# Patient Record
Sex: Female | Born: 1955 | Race: White | Hispanic: No | Smoking: Never smoker
Health system: Southern US, Community
[De-identification: ages and names within clinical notes are randomized; demographics above are authoritative.]

## PROBLEM LIST (undated history)

## (undated) DIAGNOSIS — M1990 Unspecified osteoarthritis, unspecified site: Secondary | ICD-10-CM

## (undated) DIAGNOSIS — Z9889 Other specified postprocedural states: Secondary | ICD-10-CM

## (undated) DIAGNOSIS — N189 Chronic kidney disease, unspecified: Secondary | ICD-10-CM

## (undated) DIAGNOSIS — I10 Essential (primary) hypertension: Secondary | ICD-10-CM

## (undated) DIAGNOSIS — M797 Fibromyalgia: Secondary | ICD-10-CM

## (undated) HISTORY — DX: Chronic kidney disease, unspecified: N18.9

## (undated) HISTORY — PX: ABDOMINAL HYSTERECTOMY: SHX81

## (undated) HISTORY — PX: CHOLECYSTECTOMY: SHX55

---

## 2004-08-28 ENCOUNTER — Other Ambulatory Visit: Payer: Self-pay

## 2005-01-08 ENCOUNTER — Ambulatory Visit: Payer: Self-pay | Admitting: Cardiovascular Disease

## 2005-05-11 ENCOUNTER — Emergency Department: Payer: Self-pay | Admitting: Emergency Medicine

## 2005-08-28 ENCOUNTER — Ambulatory Visit: Payer: Self-pay | Admitting: Internal Medicine

## 2005-09-11 ENCOUNTER — Ambulatory Visit: Payer: Self-pay | Admitting: Internal Medicine

## 2005-09-23 ENCOUNTER — Other Ambulatory Visit: Payer: Self-pay

## 2005-10-03 ENCOUNTER — Ambulatory Visit: Payer: Self-pay | Admitting: Unknown Physician Specialty

## 2005-11-05 ENCOUNTER — Ambulatory Visit: Payer: Self-pay | Admitting: Obstetrics & Gynecology

## 2005-11-07 ENCOUNTER — Observation Stay: Payer: Self-pay | Admitting: Unknown Physician Specialty

## 2006-01-07 ENCOUNTER — Ambulatory Visit: Payer: Self-pay | Admitting: Internal Medicine

## 2006-01-10 ENCOUNTER — Ambulatory Visit: Payer: Self-pay | Admitting: Internal Medicine

## 2006-03-07 ENCOUNTER — Ambulatory Visit (HOSPITAL_COMMUNITY): Admission: RE | Admit: 2006-03-07 | Discharge: 2006-03-07 | Payer: Self-pay | Admitting: Neurosurgery

## 2006-03-26 ENCOUNTER — Encounter: Admission: RE | Admit: 2006-03-26 | Discharge: 2006-03-26 | Payer: Self-pay | Admitting: Neurosurgery

## 2006-04-09 ENCOUNTER — Encounter: Admission: RE | Admit: 2006-04-09 | Discharge: 2006-04-09 | Payer: Self-pay | Admitting: Neurosurgery

## 2006-09-04 ENCOUNTER — Encounter: Admission: RE | Admit: 2006-09-04 | Discharge: 2006-09-04 | Payer: Self-pay | Admitting: Neurosurgery

## 2006-09-17 ENCOUNTER — Ambulatory Visit: Payer: Self-pay | Admitting: Gastroenterology

## 2006-11-07 ENCOUNTER — Ambulatory Visit: Payer: Self-pay | Admitting: Internal Medicine

## 2007-06-09 ENCOUNTER — Encounter: Admission: RE | Admit: 2007-06-09 | Discharge: 2007-06-09 | Payer: Self-pay | Admitting: Neurosurgery

## 2007-07-14 ENCOUNTER — Inpatient Hospital Stay (HOSPITAL_COMMUNITY): Admission: RE | Admit: 2007-07-14 | Discharge: 2007-07-16 | Payer: Self-pay | Admitting: Neurosurgery

## 2007-09-14 ENCOUNTER — Encounter: Payer: Self-pay | Admitting: Neurosurgery

## 2007-10-10 ENCOUNTER — Encounter: Payer: Self-pay | Admitting: Neurosurgery

## 2007-12-05 IMAGING — CT CT L SPINE W/ CM
4 of 13 series · 9 of 33 positions shown, 10 images · IV contrast (omnipaque)
Comparison: none

CLINICAL DATA: Low back and bilateral lower extremity pain. No relief from
epidural steroid injections.

THORACIC MYELOGRAM: 
LUMBAR MYELOGRAM:
CT THORACIC SPINE WITH INTRATHECAL CONTRAST:
CT LUMBAR SPINE WITH INTRATHECAL CONTRAST:
TECHNIQUE: An appropriate entry site was determined under fluoroscopy. Skin site
was marked, prepped with Betadine, and draped in usual sterile fashion, and
infiltrated locally with 1% lidocaine. A 22 gauge spinal needle was  advanced
into the thecal sac at L4 from a left interlaminar approach . Clear colorless
CSF returned. 10 ml Omnipaque 100 were administered intrathecally for thoracic
and lumbar myelography, followed by axial CT scanning of the thoracic and lumbar
spine. Coronal and sagittal reconstructions were generated from the axial scans.

[Series 2: t & l spine · axial · 0.31mm/px · z∈[-472,-289]mm · 2 of 219 slices shown]
[im 73/219  bone]
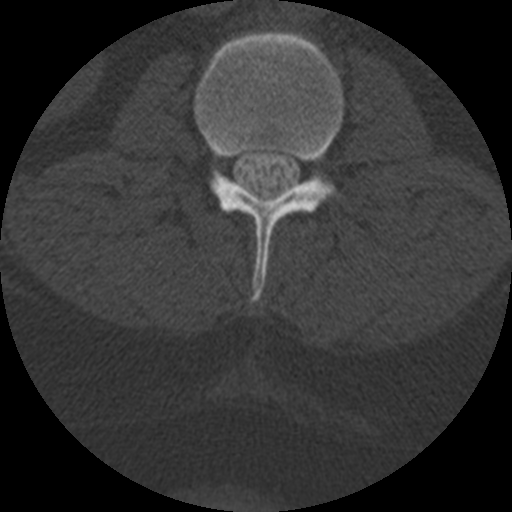
[im 146/219  bone]
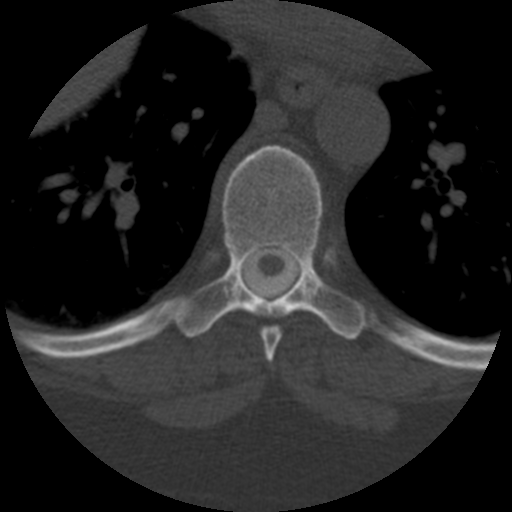

[Series 3: bone windows · axial · 0.31mm/px · z∈[-516,-244]mm · 3 of 219 slices shown, 4 images]
[im 55/219  soft-tissue]
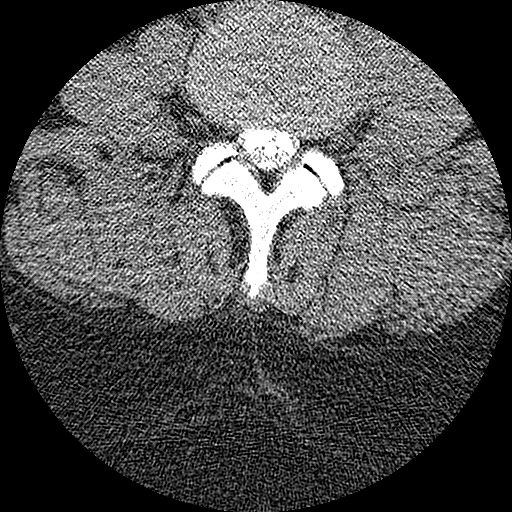
[im 55/219  bone]
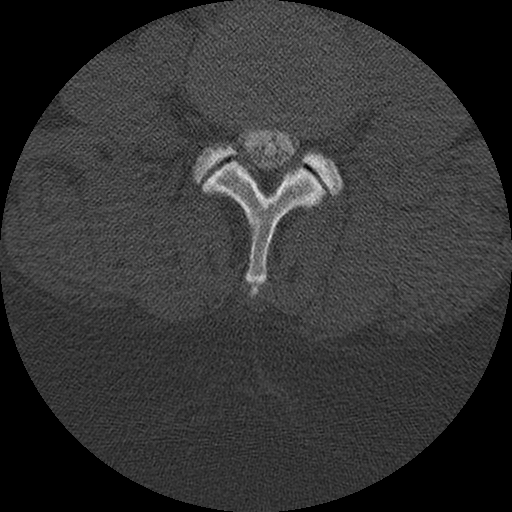
[im 110/219  bone]
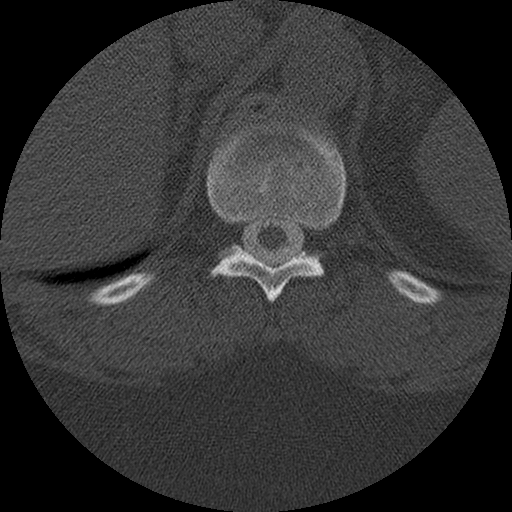
[im 164/219  bone]
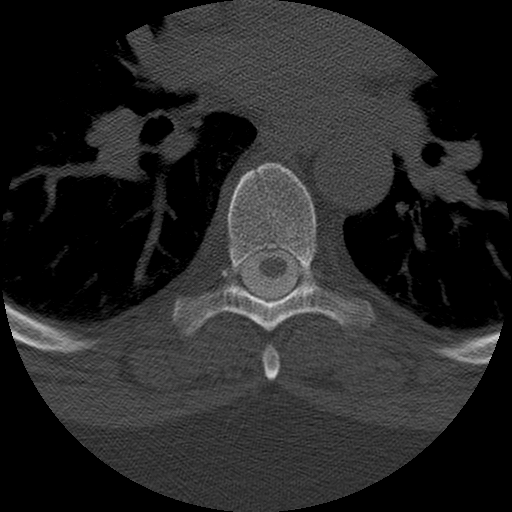

[Series 103: sagittal t spine · sagittal · 0.72mm/px · 2 of 40 slices shown]
[im 14/40  bone]
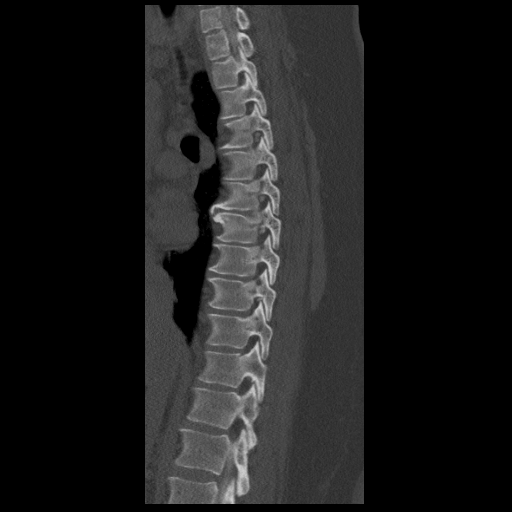
[im 27/40  bone]
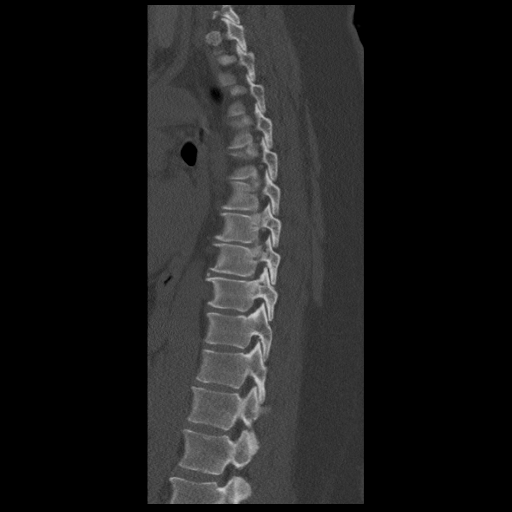

[Series 400: coronal t & l · coronal · 1.09mm/px · 2 of 60 slices shown]
[im 20/60  bone]
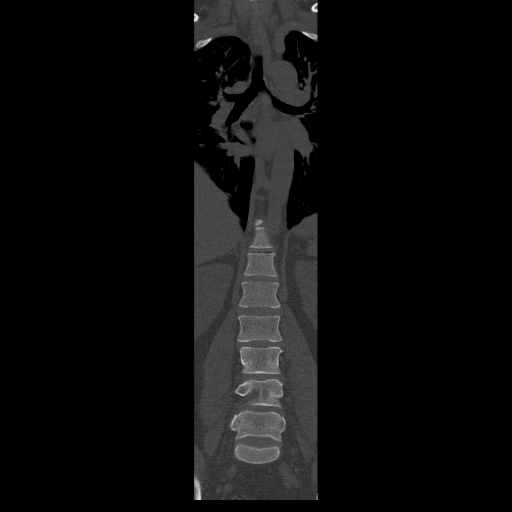
[im 40/60  bone]
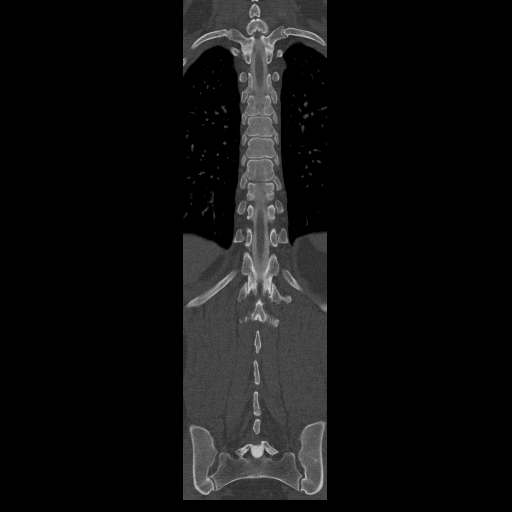

[9 of 33 positions shown; findings below may reference images not displayed]

FINDINGS: Thoracic spine: 

C7-T5: Unremarkable

T5-T6: Right paracentral herniation indenting and distorting the right anterior
aspect of the thoracic cord. No definite change since previous scan.

T6-T7: Minimal left right paracentral bulge without cord distortion. Stable
since previous scan.

T7-T10: Unremarkable

T10-T11: Bilateral facet degenerative hypertrophy with a anteromedial spurring
encroaching on the central canal resulting in a mild trefoil configuration of
the thoracic cord and thecal sac, stable since previous scan. Small Schmorl's
nodes are noted in the adjacent endplates.

T11-T12: Small anterior plate spurs with early vacuum phenomenon anteriorly. No
posterior disc bulge or protrusion. 

T12-L1: Unremarkable

Lumbar spine:

L1-L2: Normal appearing conus terminates at the interspace level. No significant
protrusion or herniation.

L2-L3: Mild disc bulge and foraminal encroachment left greater than right

L3-L4: Mild asymmetric disc bulge left greater than right with early left
foraminal encroachment. Bilateral early facet degenerative hypertrophy. No
spinal stenosis.

L4-L5: Mild circumferential disc bulge. More advanced bilateral facet
degenerative hypertrophy with vacuum phenomenon, thickening of ligamentum
flavum, and narrowing of the lateral dimension of the thecal sac. Early
bilateral foraminal encroachment, right greater than left.

L5-S1: Mild bilateral facet degenerative hypertrophy. Interspace unremarkable.
Foramina widely patent.
IMPRESSION: 1. Stable right paracentral herniation T5-T6 with cord distortion.
2. Stable facet degenerative changes T10-T11 with early narrowing of the thecal
sac.
3. Stable mild disc bulges L2-L3, L3-L4 without significant compressive
pathology.
4. Early multifactorial spinal stenosis L4-L5, stable.

## 2008-02-03 ENCOUNTER — Ambulatory Visit: Payer: Self-pay | Admitting: Internal Medicine

## 2008-02-05 ENCOUNTER — Ambulatory Visit: Payer: Self-pay | Admitting: Internal Medicine

## 2008-03-15 ENCOUNTER — Inpatient Hospital Stay: Payer: Self-pay | Admitting: Internal Medicine

## 2008-03-15 ENCOUNTER — Other Ambulatory Visit: Payer: Self-pay

## 2008-03-16 ENCOUNTER — Other Ambulatory Visit: Payer: Self-pay

## 2008-05-12 ENCOUNTER — Ambulatory Visit: Payer: Self-pay | Admitting: Orthopaedic Surgery

## 2008-07-12 ENCOUNTER — Emergency Department: Payer: Self-pay | Admitting: Emergency Medicine

## 2008-07-22 ENCOUNTER — Encounter: Admission: RE | Admit: 2008-07-22 | Discharge: 2008-07-22 | Payer: Self-pay | Admitting: Orthopaedic Surgery

## 2008-07-27 ENCOUNTER — Ambulatory Visit: Payer: Self-pay | Admitting: Internal Medicine

## 2008-08-10 ENCOUNTER — Encounter
Admission: RE | Admit: 2008-08-10 | Discharge: 2008-08-12 | Payer: Self-pay | Admitting: Physical Medicine & Rehabilitation

## 2008-08-12 ENCOUNTER — Ambulatory Visit: Payer: Self-pay | Admitting: Physical Medicine & Rehabilitation

## 2008-08-26 ENCOUNTER — Encounter: Admission: RE | Admit: 2008-08-26 | Discharge: 2008-11-24 | Payer: Self-pay | Admitting: Anesthesiology

## 2008-08-30 ENCOUNTER — Ambulatory Visit: Payer: Self-pay | Admitting: Anesthesiology

## 2008-09-13 ENCOUNTER — Ambulatory Visit: Payer: Self-pay | Admitting: Anesthesiology

## 2008-10-11 ENCOUNTER — Ambulatory Visit: Payer: Self-pay | Admitting: Anesthesiology

## 2008-10-25 ENCOUNTER — Ambulatory Visit: Payer: Self-pay | Admitting: Anesthesiology

## 2008-11-24 ENCOUNTER — Encounter
Admission: RE | Admit: 2008-11-24 | Discharge: 2009-02-22 | Payer: Self-pay | Admitting: Physical Medicine & Rehabilitation

## 2008-11-25 ENCOUNTER — Ambulatory Visit: Payer: Self-pay | Admitting: Physical Medicine & Rehabilitation

## 2008-12-30 ENCOUNTER — Ambulatory Visit: Payer: Self-pay | Admitting: Physical Medicine & Rehabilitation

## 2009-03-13 ENCOUNTER — Emergency Department: Payer: Self-pay | Admitting: Emergency Medicine

## 2009-03-15 ENCOUNTER — Ambulatory Visit: Payer: Self-pay | Admitting: Internal Medicine

## 2009-03-23 ENCOUNTER — Encounter
Admission: RE | Admit: 2009-03-23 | Discharge: 2009-03-27 | Payer: Self-pay | Admitting: Physical Medicine & Rehabilitation

## 2009-03-27 ENCOUNTER — Ambulatory Visit: Payer: Self-pay | Admitting: Physical Medicine & Rehabilitation

## 2009-07-10 ENCOUNTER — Encounter
Admission: RE | Admit: 2009-07-10 | Discharge: 2009-10-08 | Payer: Self-pay | Admitting: Physical Medicine & Rehabilitation

## 2009-07-17 ENCOUNTER — Ambulatory Visit: Payer: Self-pay | Admitting: Physical Medicine & Rehabilitation

## 2009-07-29 ENCOUNTER — Ambulatory Visit: Payer: Self-pay | Admitting: Internal Medicine

## 2009-08-22 ENCOUNTER — Encounter: Admission: RE | Admit: 2009-08-22 | Discharge: 2009-08-22 | Payer: Self-pay | Admitting: Internal Medicine

## 2009-08-25 ENCOUNTER — Encounter: Admission: RE | Admit: 2009-08-25 | Discharge: 2009-08-29 | Payer: Self-pay | Admitting: Anesthesiology

## 2009-08-29 ENCOUNTER — Ambulatory Visit: Payer: Self-pay | Admitting: Anesthesiology

## 2010-02-15 ENCOUNTER — Encounter: Payer: Self-pay | Admitting: Rheumatology

## 2010-03-09 ENCOUNTER — Encounter: Payer: Self-pay | Admitting: Rheumatology

## 2010-03-19 ENCOUNTER — Ambulatory Visit: Payer: Self-pay | Admitting: Internal Medicine

## 2010-04-08 ENCOUNTER — Encounter: Payer: Self-pay | Admitting: Rheumatology

## 2010-04-09 ENCOUNTER — Ambulatory Visit: Payer: Self-pay | Admitting: Gastroenterology

## 2010-05-09 ENCOUNTER — Encounter: Payer: Self-pay | Admitting: Rheumatology

## 2010-06-18 ENCOUNTER — Ambulatory Visit: Payer: Self-pay | Admitting: Rheumatology

## 2010-12-30 ENCOUNTER — Encounter: Payer: Self-pay | Admitting: Neurosurgery

## 2011-04-23 NOTE — Op Note (Signed)
NAMEAVIGAIL, PILLING                 ACCOUNT NO.:  [id number]   MEDICAL RECORD NO.:  [id number]          PATIENT TYPE:  INP   LOCATION:  3172                         FACILITY:  [facility]   PHYSICIAN:  Hilda Lias, M.D.   DATE OF BIRTH:  04-17-1956   DATE OF PROCEDURE:  DATE OF DISCHARGE:                               OPERATIVE REPORT   Audio too short to transcribe (less than 5 seconds)           ______________________________  Hilda Lias, M.D.     EB/MEDQ  D:  07/14/2007  T:  07/14/2007  Job:  [id number]

## 2011-04-23 NOTE — Procedures (Signed)
NAME:  Sharon Boyd, Sharon Boyd NO.:  [id number]   MEDICAL RECORD NO.:  [id number]         PATIENT TYPE:  HREC   LOCATION:                                 FACILITY:   PHYSICIAN:  Celene Kras, MD        DATE OF BIRTH:  1956-03-18   DATE OF PROCEDURE:  DATE OF DISCHARGE:                               OPERATIVE REPORT   [facility].  I evaluated  her, reviewed the health and history, and performed 14-point review of  systems.  1. Reviewed the chart, progress to date, symptoms, and discussed      treatment limitations and options.  I used models.  I discussed in      lay terms.  2. Describing a spondylitic pain, minimal arm pain, more of a facetal      overlay.  It is reasonable to go on with cervical facet      intervention.  Her work is aggravating her neck, particularly to      the left side, although she has some modest right-sided pain.  Left      side is most problematic for her.  She has to look over shoulder      and she rides a forklift all day and this does contribute to quite      a bit of the myofascial pain, and functional impairment.  We will      go ahead and block the facet at the medial branch, as most      problematic levels are described at 5, 6, and 7 and 4 will be      addressed as contributory innervation.  This would be at      independent needle access points.  3. Maintain contact with primary care.  4. Another rationale to perform intervention is to minimize escalation      of controlled substances.  Questions are answered and discussed in      lay terms.  She is familiar with these interventions as she has had      them with [facility], this is mostly in her low back, not      in her neck.  She has been followed by Orthopedist, no particular      surgical plan is in place at this time.  We will see her for      sequential intervention in followup, consider RF with positive      predictive  experience, and I have discussed benchmarks for her.   Objectively, diffuse paracervical myofascial with positive cervical  facetal compression test, particularly left greater than right.  Suboccipital compression test positive.  Suprascapular levator scapular  amplification, left and right, but notably more so on the left.  Pain  with extension.  Nothing new neurologically.   IMPRESSION:  Spondylosis, degenerative spinal disease, cervical spine.   PLAN:  1. Cervical facet medial branch injection at C4, C5, C6, and C7 left      side, independent needle access points under local anesthetic.  Predicate further intervention based on need and overall response.      Questions answered, discussed in lay terms.  2. Followup.  Discussed home-based therapy.  3. Questions were answered.  I used models.  She is consented.   The patient taken to the fluoroscopy suite and placed in supine  position.  Neck prepped and draped in the usual fashion.  Using a 25-  gauge needle, I advanced the cervical facet at the medial branch of C4,  C5, C6, and C7 with contributory innervation addressed.  Confirmed  placement.  I then injected 1.5 mL of lidocaine 1% MPF at each level  with a total of 40 mg of Aristocort in divided doses.   She tolerated the procedure well.  No complication from our procedure.  Appropriate recovery.  Discharge instructions given.           ______________________________  Celene Kras, MD     HH/MEDQ  D:  08/30/2008 13:50:36  T:  08/31/2008 02:25:59  Job:  [id number]

## 2011-04-23 NOTE — Procedures (Signed)
NAME:  Sharon Boyd, Sharon Boyd                 ACCOUNT NO.:  [id number]   MEDICAL RECORD NO.:  [id number]          PATIENT TYPE:  REC   LOCATION:  TPC                          FACILITY:  [facility]   PHYSICIAN:  Erick Colace, M.D.DATE OF BIRTH:  1956-04-06   DATE OF PROCEDURE:  DATE OF DISCHARGE:                               OPERATIVE REPORT   INDICATION:  Occipital neuralgia.   Informed consent was obtained after describing risks and benefits of the  procedure with the patient.  These include bleeding, bruising,  infection.  She elects to proceed and has given written consent.   The patient in seated position, area marked at the base of occiput.  Mark prepped with Betadine, entered with 25-gauge 1-1/2-inch needle  solution containing 1 mL of 4 mg/mL dexamethasone and 1 mL of 1%  lidocaine injected.  The patient tolerated the procedure well.  Post  injection instructions given.      Erick Colace, M.D.  Electronically Signed     AEK/MEDQ  D:  11/25/2008 16:09:44  T:  11/26/2008 05:27:15  Job:  [id number]

## 2011-04-23 NOTE — Assessment & Plan Note (Signed)
A 55 year old female with cervical pain, has done well with  radiofrequency neuropathy.  This was last performed on October 12, 2008.  She has been off of narcotic analgesic medications, maintained on  tramadol 50 mg 2 p.o. t.i.d. and gabapentin.  She will be due for  refills later on this month.  Her gabapentin dose is 300 p.o. t.i.d.   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  She  states in some way improved her neck pain since she is not looking up  all the time, but her pain is still in 6-7/10 range.  This is higher  than last time where it was 5/10.  Her pain interference score is 5-  6/10.   She needs assist with household duties and shopping.  Otherwise  independent.   REVIEW OF SYSTEMS:  Positive for weakness, dizziness, depression,  anxiety, nausea, limb swelling, sleep apnea, weight gain.   Examination; blood pressure 133/89, pulse 94, respirations 18 and O2 sat  98% on room air.  General, no acute distress.  Mood and affect  appropriate.  Upper extremity strength is normal.  Upper extremity deep  tendon reflex is normal.  No evidence of intrinsic atrophy.  Sensation  is normal.  Lumbar spine range of motion is 75% forward flexion and  extension.  Her lower extremity strength is normal.  Deep tendon  reflexes are normal.   IMPRESSION:  1. Cervical pain due to facet syndrome.  We will have her go to get a      repeat RF per Dr. Stevphen Rochester.  2. Lumbar post laminectomy syndrome, well controlled on current pain      medications.  I will see her back in about 6 months.  Dr. Stevphen Rochester      will see her in next month.      Erick Colace, M.D.  Electronically Signed     AEK/MedQ  D:  07/17/2009 12:41:33  T:  07/18/2009 01:48:21  Job #:  [id number]   cc:   Celene Kras, MD  Fax: [phone]   Sharolyn Douglas, M.D.  Fax: [phone]

## 2011-04-23 NOTE — Procedures (Signed)
NAME:  SANSA, ALKEMA NO.:  [id number]   MEDICAL RECORD NO.:  [id number]          PATIENT TYPE:  REC   LOCATION:  TPC                          FACILITY:  [facility]   PHYSICIAN:  Celene Kras, MD        DATE OF BIRTH:  Jan 29, 1956   DATE OF PROCEDURE:  DATE OF DISCHARGE:                               OPERATIVE REPORT   Sharon Boyd comes in for pain management today.  I evaluated her and  reviewed the health and history form and 14-point review of systems.   Sharon Boyd comes to [place] today, demonstrating a clear response to left RF,  stating that the right side is evident, requesting intervention here.  Her frequency, intensity, and duration of headache have diminished, but  they are focused more to the right.  Spondylotic pain, minimal  myelopathy.  It is reasonable to go on to RF at the third occipital  nerve, particularly at the bifurcation, with a four and five.  This will  be on the right side, with risks, complications, and options are  reinforced.   OBJECTIVE:  She is improved to the left side, better range of motion,  and less suprascapular myofascial pain to the left side.  The right side  is so problematic with positive cervical facet compression test but  nothing new neurologically.   IMPRESSION:  Spondylosis, degenerative spine disease, cervical spine.   PLAN:  Cervical facet medial branch intervention, with radiofrequency  neuroablation 5 mm active tip to the right side at 3, 4, and 5  independent needle access points.  She has consented to predicate  further intervention based on need and overall response.   The patient was taken to the fluoroscopy suite and placed in the supine  position.  The neck was prepped and draped in the usual fashion.  Using  a 22-gauge RF needle, I advanced the cervical facet at the medial branch  at C3, 4, and 5.  Her right side independent needle access points under  local anesthetic confirmed placement appropriately, motor and  sensory  stimulation following with 60 degrees lesion, at 60 seconds at each  level.   Preceding this was 0.5 mL of lidocaine 1% MPF at each level with a total  of 40 mg of Aristocort in divided dose.   She tolerated the procedure well.  No complications from our procedure.  Appropriate recovery.  Discharge instructions given.  I will see her in  followup.           ______________________________  Celene Kras, MD     HH/MEDQ  D:  10/25/2008 14:16:49  T:  10/26/2008 06:26:16  Job:  [id number]

## 2011-04-23 NOTE — H&P (Signed)
NAME:  Sharon Boyd, NADAL NO.:  [id number]   MEDICAL RECORD NO.:  [id number]          PATIENT TYPE:  INP   LOCATION:  NA                           FACILITY:  [facility]   PHYSICIAN:  Hilda Lias, M.D.   DATE OF BIRTH:  12-24-1955   DATE OF ADMISSION:  DATE OF DISCHARGE:                              HISTORY & PHYSICAL   HISTORY:  The patient is  __________  who I have been following for more  than a year, complaining of pain to the shoulder down to the coccyx,  pain in both legs associated with numbness.  Coughing increases the  pain.  The patient had workup, which showed some T10-11 positive  stenosis and  __________  with MRI.  She is complaining of increase of  the pain to both her upper extremities.  The x-rays showed that she had  stenosis of the level L4-5, as well as incidental thoracic stenosis.  Because of the pain, she wants to proceed with surgery.   PAST MEDICAL HISTORY:  1. Hysterectomy.  2. Heart catheterization.  3. Cholecystectomy.   ALLERGIES:  She is allergic to CODEINE.   SOCIAL HISTORY:  She drinks socially.  She does not smoke.  She is 5  feet 10 inches, 260 pounds.   REVIEW OF SYSTEMS:  Positive for high cholesterol, high blood pressure,  back pain, swelling of the legs.   PHYSICAL EXAMINATION:  HEAD:  Normocephalic, normal..  NECK:  Normal.  LUNGS:  Clear.  HEART:  Normal __________  normal pulses.  NEURO:  Showed she has straight leg raising, it is __________  on the  right side __________  45 degrees.  She has some mild weakness  __________  both legs.  Grip strength is 1+.   Lumbar spine x-ray showed that she has stenosis at the level L4-5 and  incidental stenosis at the level of T10-11  which has not changed.   IMPRESSION:  The patient is being admitted for surgery.  The procedure  will be a bilateral 4-5 laminectomy and foraminotomy.  She knows about  the risks __________  paralysis, need for further surgery.  __________ .           ______________________________  Hilda Lias, M.D.     EB/MEDQ  D:  07/14/2007  T:  07/14/2007  Job:  [id number]

## 2011-04-23 NOTE — Assessment & Plan Note (Signed)
Sharon Boyd follows up today.  She had good relief with occipital nerve  injection, although symptoms seem to be coming back.  She has overall  done quite well after her cervical radiofrequency procedure.  Her back  pain has been doing well after the physical therapy keeps up with this.  Her main complaint is looking upward seems to bother her.   She has another complaint of episodes of nausea, vomiting, and dizziness  about twice a week, seem to occur when she is driving, forklift, and  looking upward, but sometimes they also occur spontaneously.   Her pain with activity is about 4/10.  She is working in third shift and  overall sleep is fair.  Relief from meds is fair.  She can walk 10  minutes at a time.  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  She has some dizziness, depression, nausea, and vomiting at times.   PHYSICAL EXAMINATION:  VITAL SIGNS:  Blood pressure 141/94, pulse 88,  respirations 18, and O2 sat 99% on room air.  GENERAL:  No acute distress.  Orientation x3.  Affect alert.  Gait is  normal.  EXTREMITIES:  She has normal coordination of upper and lower extremity.  No deep tendon reflex.  Normal sensation.  Turning her head side-to-side  or upper down does not provoke any of her symptoms.  She has normal  strength in the upper extremities.  Normal gait pattern.  BACK:  There is no tenderness to palpation.  Lumbar range of motion 50%  forward flexion and extension.  NECK:  Range of motion 50% extension, flexion, and lateral bending.  NEUROLOGIC:  Cranial nerves II through XII are intact.   IMPRESSION:  1. Lumbar spondylosis without myelopathy.  2. Lumbar post laminectomy syndrome, improved.  3. Cervicalgia, improved after cervical radiofrequency procedure.   FOLLOWUP:  Intermittent dizziness, nausea.  I suspect this may be more  middle ear related and would like her to follow up with her primary care  physician on this.  We discussed this.  She is in  agreement with the  treatment plans all, otherwise see her back in 2 months.  We will  continue tramadol 2 p.o. t.i.d. as well as gabapentin 1 p.o. t.i.d. at  300 mg formulation.   Her Oswestry disability index score today is 44%.      Erick Colace, M.D.  Electronically Signed     AEK/MedQ  D:  12/30/2008 17:36:55  T:  12/31/2008 09:07:09  Job #:  [id number]   cc:   Sharolyn Douglas, M.D.  Fax: [phone]

## 2011-04-23 NOTE — Op Note (Signed)
Sharon Boyd, Sharon Boyd                 ACCOUNT NO.:  [id number]   MEDICAL RECORD NO.:  [id number]          PATIENT TYPE:  INP   LOCATION:  3172                         FACILITY:  [facility]   PHYSICIAN:  Hilda Lias, M.D.   DATE OF BIRTH:  02/03/1956   DATE OF PROCEDURE:  07/14/2007  DATE OF DISCHARGE:                               OPERATIVE REPORT   PREOPERATIVE DIAGNOSIS:  Lumbar stenosis L4-5.   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   PROCEDURE:  Bilateral L4-5 laminectomy, bilateral L4-5, L5-S1  foraminotomy, removal of L4-5 synovial cyst.  Microscope.   SURGICAL PROCEDURES:  Hilda Lias, M.D.   ASSISTANT:  Coletta Memos, M.D.   MEDICAL HISTORY:  The patient has been followed by me for more than a  year complaining of back pain with radiation to both legs.  The pain is  getting worse.  She had two myelograms, the one back in March of 2007  and the one at the beginning of July of this year which showed that she  had stenosis at the level of L4-5.  She also has incidental thoracic  stenosis.  Surgery was advised.  The patient knew all the risks such as  infection, CSF leak, worsening pain, paralysis, need for further  surgery.   DESCRIPTION OF PROCEDURE:  The patient was taken to the OR and she was  positioned in a prone manner.  The back was cleaned with DuraPrep.  X-  rays showed that the needle was at the level of 4-5. From then on, we  were doing an incision in the skin and subcutaneous tissue 3-1/2 to 4  inches thick adipose tissue.  We found the spinous process and  retraction of the muscle and the ligament was done laterally.  At the  end we were able to feel the area between 4-5 and 5-1. With the  __________  we removed the spinous process of 5 and 4.  With the drill  and using 2 and 3 mm Kerrison punch with the bilateral laminectomy.  The  patient had quite a bit of thickening of the yellow ligament mostly at  the level of 4-5 and  borderline between 3 and 4.  Having good  decompression laminectomy, we found out that indeed at the level of 4-5  to the left there was a small synovial cyst displacing the thecal sac as  well as the L5 nerve root. With the 1 and 2 mm Kerrison punch as well  for microdissection, we were able to remove the synovial cyst.  At the  end we had good decompression.  Foraminotomy was done with plenty of  room for the L4, L5-S1 nerve root.  From there, the area was irrigated.  There was a small opening where the spinal tap was made which was taken  care of with coagulation point.  Muscles were  laid on top.  Valsalva maneuver was negative.  From then on the area was  irrigated.  The wound was closed with Vicryl and Steri-Strips.  ______________________________  Hilda Lias, M.D.     EB/MEDQ  D:  07/14/2007  T:  07/14/2007  Job:  [id number]

## 2011-04-23 NOTE — Group Therapy Note (Signed)
Consult requested by Dr. Sharolyn Douglas for the evaluation of pain related to  neck and back.   HISTORY:  The patient is a 55 year old female who has 8 months history  of neck pain, which she states with gradual onset.  It has worsened over  the last few months.  She has had no significant pain going down her  arms.  No new problems with bowel or bladder.  She has had shoulder pain  in the past, but this was not always felt to be related to her neck.   She has had back pain during stretching back 6 years ago.  She has had  MRI of the lumbar spine demonstrated herniated disk and did undergo a L4-  L5 laminectomy, foraminotomy, L5-S1 foraminotomy and removal of L4-L5  cyst on the left side by Dr. Jeral Fruit.  She had no postoperative  complications.  She had leg pain that improved after surgery, but her  back pain really did not.  Her walking tolerance is about 30 minutes.  She climbs steps.  She drives.  Her pain does get worse with walking,  bending, sitting, and standing.  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  She has been off 2  weeks taken off by her primary care physician to see whether this would  help her neck pain which she thinks has.  She has had problems with  bladder, bowel, weakness, dizziness, and depression.  She has had  constipation, reflux, limb swelling, and sleep problems.  She has also  had been diagnosed with bipolar disorder.   PAST MEDICAL HISTORY:  Also includes hypertension, stomach problems.   PAST SURGICAL HISTORY:  Cholecystectomy in April 2006, hysterectomy in  November 2008.  She has had cardiac catheterization.  I do not have the  result of that.  She has had knee surgery in April 2002.   SOCIAL HISTORY:  Married and lives with her husband.   FAMILY HISTORY:  Heart disease, diabetes, high blood pressure, and  alcohol abuse.  Her sister is  here with her, her sister is a Engineer, civil (consulting) and  her health care power of attorney.   REVIEW OF IMAGING STUDIES:  Include a CT myelogram fairly unremarkable  with minimal facet degeneration on the left at C4-5 shallow disk  protrusion at C5-6.  No evidence of neural compression.   She has had imaging studies of her lumbar spine dated May 12, 2008  showing L2-L3, L3-L4, L4-L5 disk degeneration.  No evidence of disk  protrusion or spinal stenosis.  No significant changes since January 07, 2006.  She has had old MRI of her thoracic spine in 2007, which is  showing a large disk bulge at T10-11 and stenosis at that level, which  appeared severe.  She does not indicate any significant pain at that  level at this current time, however.   PHYSICAL EXAMINATION:  GENERAL:  In no acute distress. Mood and affect  appropriate.  VITAL SIGNS:  Blood pressure 135/80, pulse 93, respiratory rate 18 and  O2 sat 98% on room air.   A well-developed, well-nourished female in no acute distress,  orientation x3.  Affect is alert.  Gait is with a limp.  MUSCULOSKELETAL:  Deep tendon reflexes are normal and her sensation is  decreased at C7 dermatome, left index finger indicating C6, C7  and right  L4 dermatome.  Her motor strength is 5/5 at bilateral deltoid, triceps,  grip as well as hip flexion, knee extension, and ankle dorsiflexion.  Her upper and lower extremity range of motion is normal.  Joint  stability is normal.  He has no evidence of abnormal tremor in the upper  or lower extremities.  Extremities are without edema.  She has good  peripheral pulses.  Her neck range of motion reduced.  She has pain with hyperextension of  the neck as well as right lateral bending.  Spurlings maneuver is  negative bilaterally.   IMPRESSION:  1. Cervical pain likely cervical facet syndrome.  I think that she may      benefit from cervical facet injections.  2. Lumbar pain with post laminectomy syndrome likely diskogenic  pain      that has persisted postoperatively.  Other additional differential      would include facet versus myofascial.   PLAN:  1. We will set up for the cervical facet injections.  2. Trial of Voltaren gel to neck.  3. Physical therapy with cervical flexion, myofascial release as well      as addressing a lumbar stabilization program.  Goal of her      treatment would be to avoid disability, continue working at her      current job and reduced her disability scores.  Her Oswestry score      was 58% today.      Erick Colace, M.D.  Electronically Signed     AEK/MedQ  D:  08/12/2008 17:52:34  T:  08/13/2008 07:52:43  Job #:  [id number]   cc:   Yves Dill  Fax: [phone]

## 2011-04-23 NOTE — Procedures (Signed)
NAME:  Sharon, Boyd NO.:  [id number]   MEDICAL RECORD NO.:  [id number]           PATIENT TYPE:   LOCATION:                                 FACILITY:   PHYSICIAN:  Celene Kras, MD        DATE OF BIRTH:  [birth year]   DATE OF PROCEDURE:  DATE OF DISCHARGE:                               OPERATIVE REPORT   [facility].  I evaluated her.  Reviewed history and performed 14-point review of systems.   Sharon Boyd comes to Sharon Boyd today with about a 50% relief cycling, but with the  injection, she had some recrudescence.  After the injection, she had  near 100% relief cycling during the length of a local anesthetic, so  sequentially we will go on to bupivacaine, and I have given her  benchmarks and discussed expectations.  Should she have a significant  positive predictive experience and then recrudescence, we will probably  go on to consider radiofrequency neuroablation.   No interval change, I reviewed this, used models, discussed in lay  terms.   Objectively, diffuse paracervical myofascial discomfort with positive  cervical facetal compression test, left greater than right suprascapular  fascioscapular pain.  Range of motion impaired secondary to pain.  Pain  with extension, it is a typical pain, somewhat better range of motion.  She admits far more so with right lateral movements, consistent.  Nothing new neurologically.   IMPRESSION:  Spondylosis, degenerative spine disease, cervical spine.   PLAN:  Cervical facet medial branch interventions C4, C5, C6, and C7,  contributory intervention addressed under local anesthetic and she is  consented.  Predicate further intervention based on need and overall  response.  Questions are answered.  She is consented.   The patient was taken to the fluoroscopy suite and placed in the supine  position.  Neck prepped and draped in the usual fashion.  Using a 25-  gauge spinal needle, advanced  to the cervical facet at the medial branch  C4, C5, C6, and C7, contributory innervation addressed.  I then injected  0.5 mL of lidocaine and 1% MPF at each level with a total of 40 mg  Aristocort in divided doses.   She tolerated the procedure well.  No complications from our procedure.  Appropriate recovery.  Improved at discharge.           ______________________________  Celene Kras, MD     HH/MEDQ  D:  09/13/2008 12:50:49  T:  09/14/2008 01:19:03  Job:  [id number]

## 2011-04-23 NOTE — Assessment & Plan Note (Signed)
Sharon Boyd is a 55 year old female with history of what sounds like a  bipolar disorder.  She also has history of hypertension and  hyperlipidemia.   ALLERGIES:  She has CODEINE which causes nausea and vomiting.   SOCIAL HISTORY:  Married, lives with her husband.  Works 40 hours a  week.   Her neck had a CT myelogram showing a shallow disk at C4-5 in the left  and disk protrusion at C5-6.  She has responded well to the cervical  medial branch blocks followed by cervical radiofrequency neurotomies.   Her pain is around 4/10 and averaging at 7, interferes with activity at  a moderate level.  The pain is mainly in the neck and in the knee area.  Her back pain has improved after physical therapy including some  stretching exercises, and pelvic tilt exercises.  Her relief from meds  is fair.  She can walk 15 minutes at a time.  She climb steps.  She  drives.  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  She needs help  with shopping and household duties.   REVIEW OF SYSTEMS:  Positive for spasms, depression, but negative for  suicidal thoughts or anxiety.  She has limb swelling, weight gain,  nausea, and vomiting at times.   Has been diagnosed with irritable bowel in the past.   PAST HISTORY:  Heart disease.   PHYSICAL EXAMINATION:  VITAL SIGNS:  Blood pressure 156/97, pulse 77,  respirations 18, and O2 sat 98% on room air.  GENERAL:  A well-developed, overweight female in no acute distress.  Orientation x3.  Affect is alert.  Gait is normal.  NECK:  Some tenderness at the base of the skull, radiating to the  posterior scalp area and tenderness to palpation at the base of the  skull, occiput area.   She has normal strength in bilateral upper extremities and lower  extremities.  Her back has only minimal tenderness to palpation at  lumbar paraspinals.  Lumbar spine range of motion; 50% forward flexion  and extension.  Neck range of motion, 50% extension and flexion as well  as  lateral bending.   IMPRESSION:  1. Lumbar spondylosis with myelopathy.  2. Lumbar postlaminectomy syndrome, improved.   PLAN:  We will continue her tramadol 2 p.o. t.i.d.  No signs of  serotonin syndrome.  I will see her back in another month to see how she  is progressing.  Continue her home therapy exercises.  She had to quit  therapy a bit early because of the excessive copays.      Erick Colace, M.D.  Electronically Signed     AEK/MedQ  D:  11/25/2008 16:06:57  T:  11/26/2008 07:04:55  Job #:  [id number]   cc:   Sharolyn Douglas, M.D.  Fax: [phone]   Yves Dill  Fax: [phone]

## 2011-04-23 NOTE — Assessment & Plan Note (Signed)
The patient is a 55 year old female with cervical pain who has done well  with radiofrequency neurotomy.  This was performed on October 12, 2008.  She has had no new problems in the interval time.  She has maintained  adequate analgesia, off narcotic analgesic medications.   CURRENT MEDICATIONS:  1. Tramadol 50 mg two p.o. t.i.d.  2. Gabapentin 300 mg p.o. t.i.d.   Her pain level is 5/10 on an average, but interferes with activity on a  0/10 level.  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   REVIEW OF SYSTEMS:  Positive for depression, dizziness, limb swelling,  sleep problems, and weight gain.   PHYSICAL EXAMINATION:  VITAL SIGNS:  Blood pressure 133/77, pulse 71,  respirations 16, O2 sats 99% on room air.  GENERAL:  Obese female in no acute distress.  Orientation x3.  Affect  alert.  Gait is normal.  Her neck has 75% range forward flexion,  extension, lateral rotation, and bending.  Upper extremity strength is  normal, upper extremity deep tendon reflexes normal, and no evidence of  intrinsic atrophy.  Sensation normal.   IMPRESSION:  1. Cervical pain improved after radiofrequency procedure.  2. Lumbar post laminectomy syndrome.  Overall, improved with this as      well.   PLAN:  I will see her back in about 4 months and monitor her response.  If she has recurrence of pain, would likely repeat her cervical  radiofrequency procedure.      Erick Colace, M.D.  Electronically Signed     AEK/MedQ  D:  03/27/2009 17:09:42  T:  03/28/2009 04:54:09  Job #:  [id number]   cc:   Sharolyn Douglas, M.D.  Fax: [phone]

## 2011-04-23 NOTE — Procedures (Signed)
NAME:  SUNDEEP, CARY NO.:  [id number]   MEDICAL RECORD NO.:  [id number]         PATIENT TYPE:  APRM   LOCATION:                                 FACILITY:   PHYSICIAN:  Celene Kras, MD        DATE OF BIRTH:  Dec 26, 1955   DATE OF PROCEDURE:  DATE OF DISCHARGE:                               OPERATIVE REPORT   Sharon Boyd [facility].  I evaluated  her and reviewed the health and history form and 14-point review of  systems.   1. Responded to cervical facet medial branch intervention, she had      benchmarks obtained, 50-75% relief cycling, and I think, it is      reasonable that we go onto RF.  Risks, complications, and options      are fully outlined.  2. I have discussed this within the context of expectations.  Most      problematic side is going to be the left side, so we will focus on      that side, consider the contralateral side if necessary.  I planned      left-sided 3, 4, 5, and 6 with contributory innervation addressed.      This should help her headaches, help her spondylitic pain, and she      has demonstrated some improvement after directed care approach.  3. Questions were answered and discussed in lay terms.  She      understands the potential for complication of bleeding, infection,      nerve damage, stroke, seizure, death, and other unforeseen problems      not commonly encountered, increase in pain, or no relief cycling.      She is a 5-mm active tip under local anesthetic, and she will      follow up with this in about 3-4 weeks.  Consider the contralateral      side, if necessary.   Objectively, diffuse paracervical myofascial discomfort with positive  cervical facet compression test, particularly left greater than right.  Suboccipital compression test positive and side bending positive.  Her  typical pain is spondylitic, myofascial, nothing new neurologically.   IMPRESSION:  Spondylosis, just above the  cervical spine.   PLAN:  Facet medial branch intervention with radiofrequency neural  ablation, C3-4, 5, 6, independent needle access points under local  anesthetic.  Predicated further intervention based on need and overall  response.  Questions are answered and discussed in lay terms.   The patient was taken to the operating suite and placed in supine  position and prepped and draped in usual fashion using a 22-gauge RF  needle, 5-mm active tip.  We advanced the above-mentioned medial branch,  confirmed placement in multiple fluoroscopic positions.  Once  confirmation is obtained, we then appropriately stimulate motor sensory  and follow with 0.5 mL of lidocaine and 1% MPF at each level with a  total of 40 mg Aristocort in divided dose.   Lesion is performed at 60 degrees for 60 seconds at each level.  She tolerated the procedure well.  No complications from our procedure.  Appropriate recovery.  Discharge instructions given.  Improved at  discharge.  Assessment in the context of activities of daily living.           ______________________________  Celene Kras, MD     HH/MEDQ  D:  10/11/2008 14:09:12  T:  10/12/2008 01:58:26  Job:  [id number]

## 2011-09-23 LAB — COMPREHENSIVE METABOLIC PANEL
ALT: 23
AST: 16
BUN: 12
Calcium: 8.9
Chloride: 108
Creatinine, Ser: 0.68
GFR calc Af Amer: >60
GFR calc non Af Amer: >60
Potassium: 4.4

## 2011-09-24 ENCOUNTER — Encounter: Payer: Self-pay | Admitting: Internal Medicine

## 2011-10-10 ENCOUNTER — Encounter: Payer: Self-pay | Admitting: Internal Medicine

## 2011-11-07 ENCOUNTER — Ambulatory Visit: Payer: Self-pay | Admitting: Internal Medicine

## 2012-03-01 ENCOUNTER — Emergency Department: Payer: Self-pay | Admitting: Emergency Medicine

## 2012-11-11 ENCOUNTER — Ambulatory Visit: Payer: Self-pay | Admitting: Internal Medicine

## 2013-02-08 ENCOUNTER — Ambulatory Visit: Payer: Self-pay | Admitting: Anesthesiology

## 2013-06-16 ENCOUNTER — Ambulatory Visit: Payer: Self-pay | Admitting: Otolaryngology

## 2013-06-20 ENCOUNTER — Emergency Department: Payer: Self-pay | Admitting: Internal Medicine

## 2013-06-20 LAB — COMPREHENSIVE METABOLIC PANEL
Albumin: 4.4 g/dL (ref 3.4–5.0)
Alkaline Phosphatase: 157 U/L — ABNORMAL HIGH (ref 50–136)
Anion Gap: 12 (ref 7–16)
BUN: 16 mg/dL (ref 7–18)
Bilirubin,Total: 0.7 mg/dL (ref 0.2–1.0)
Calcium, Total: 10.7 mg/dL — ABNORMAL HIGH (ref 8.5–10.1)
Chloride: 103 mmol/L (ref 98–107)
Co2: 24 mmol/L (ref 21–32)
Creatinine: 1.05 mg/dL (ref 0.60–1.30)
EGFR (African American): >60
EGFR (Non-African Amer.): >60
Glucose: 154 mg/dL — ABNORMAL HIGH (ref 65–99)
Osmolality: 282 (ref 275–301)
Potassium: 2.9 mmol/L — ABNORMAL LOW (ref 3.5–5.1)
SGOT(AST): 57 U/L — ABNORMAL HIGH (ref 15–37)
SGPT (ALT): 68 U/L (ref 12–78)
Sodium: 139 mmol/L (ref 136–145)
Total Protein: 9 g/dL — ABNORMAL HIGH (ref 6.4–8.2)

## 2013-06-20 LAB — URINALYSIS, COMPLETE
Hyaline Cast: 1
Nitrite: NEGATIVE
Protein: NEGATIVE
RBC,UR: 3 /HPF (ref 0–5)
Specific Gravity: 1.005 (ref 1.003–1.030)
Transitional Epi: <1

## 2013-06-20 LAB — CBC
HCT: 45.7 % (ref 35.0–47.0)
HGB: 15.6 g/dL (ref 12.0–16.0)
MCH: 26.6 pg (ref 26.0–34.0)
MCHC: 34.1 g/dL (ref 32.0–36.0)
MCV: 78 fL — ABNORMAL LOW (ref 80–100)
Platelet: 302 10*3/uL (ref 150–440)
RBC: 5.87 10*6/uL — ABNORMAL HIGH (ref 3.80–5.20)
RDW: 13.8 % (ref 11.5–14.5)
WBC: 8.3 10*3/uL (ref 3.6–11.0)

## 2013-06-20 LAB — DRUG SCREEN, URINE
Cannabinoid 50 Ng, Ur ~~LOC~~: NEGATIVE (ref ?–50)
Cocaine Metabolite,Ur ~~LOC~~: NEGATIVE (ref ?–300)
MDMA (Ecstasy)Ur Screen: NEGATIVE (ref ?–500)
Methadone, Ur Screen: NEGATIVE (ref ?–300)
Opiate, Ur Screen: NEGATIVE (ref ?–300)

## 2013-06-20 LAB — TROPONIN I: Troponin-I: <0.02 ng/mL

## 2013-11-12 ENCOUNTER — Ambulatory Visit: Payer: Self-pay | Admitting: Nurse Practitioner

## 2013-12-12 ENCOUNTER — Inpatient Hospital Stay: Payer: Self-pay | Admitting: Internal Medicine

## 2013-12-12 LAB — COMPREHENSIVE METABOLIC PANEL
ALBUMIN: 3.3 g/dL — AB (ref 3.4–5.0)
Alkaline Phosphatase: 111 U/L
Anion Gap: 7 (ref 7–16)
BUN: 27 mg/dL — ABNORMAL HIGH (ref 7–18)
Bilirubin,Total: 0.7 mg/dL (ref 0.2–1.0)
CREATININE: 1.03 mg/dL (ref 0.60–1.30)
Calcium, Total: 9.3 mg/dL (ref 8.5–10.1)
Chloride: 98 mmol/L (ref 98–107)
Co2: 26 mmol/L (ref 21–32)
EGFR (African American): >60
GLUCOSE: 129 mg/dL — AB (ref 65–99)
OSMOLALITY: 269 (ref 275–301)
Potassium: 3.4 mmol/L — ABNORMAL LOW (ref 3.5–5.1)
SGOT(AST): 28 U/L (ref 15–37)
SGPT (ALT): 33 U/L (ref 12–78)
Sodium: 131 mmol/L — ABNORMAL LOW (ref 136–145)
TOTAL PROTEIN: 8.4 g/dL — AB (ref 6.4–8.2)

## 2013-12-12 LAB — CBC
HCT: 39.4 % (ref 35.0–47.0)
HGB: 13.4 g/dL (ref 12.0–16.0)
MCH: 26.4 pg (ref 26.0–34.0)
MCHC: 34.1 g/dL (ref 32.0–36.0)
MCV: 78 fL — AB (ref 80–100)
Platelet: 195 10*3/uL (ref 150–440)
RBC: 5.08 10*6/uL (ref 3.80–5.20)
RDW: 14 % (ref 11.5–14.5)
WBC: 14.6 10*3/uL — AB (ref 3.6–11.0)

## 2013-12-13 LAB — BASIC METABOLIC PANEL
Anion Gap: 5 — ABNORMAL LOW (ref 7–16)
BUN: 21 mg/dL — AB (ref 7–18)
CALCIUM: 8.8 mg/dL (ref 8.5–10.1)
CHLORIDE: 101 mmol/L (ref 98–107)
CO2: 26 mmol/L (ref 21–32)
CREATININE: 0.93 mg/dL (ref 0.60–1.30)
EGFR (African American): >60
EGFR (Non-African Amer.): >60
GLUCOSE: 103 mg/dL — AB (ref 65–99)
OSMOLALITY: 268 (ref 275–301)
Potassium: 3 mmol/L — ABNORMAL LOW (ref 3.5–5.1)
Sodium: 132 mmol/L — ABNORMAL LOW (ref 136–145)

## 2013-12-13 LAB — CBC WITH DIFFERENTIAL/PLATELET
BASOS ABS: 0 10*3/uL (ref 0.0–0.1)
Basophil %: 0.2 %
Eosinophil #: 0.1 10*3/uL (ref 0.0–0.7)
Eosinophil %: 0.9 %
HCT: 33.3 % — AB (ref 35.0–47.0)
HGB: 11.8 g/dL — AB (ref 12.0–16.0)
Lymphocyte #: 1.1 10*3/uL (ref 1.0–3.6)
Lymphocyte %: 10.1 %
MCH: 27.4 pg (ref 26.0–34.0)
MCHC: 35.3 g/dL (ref 32.0–36.0)
MCV: 78 fL — AB (ref 80–100)
Monocyte #: 0.6 x10 3/mm (ref 0.2–0.9)
Monocyte %: 5.5 %
Neutrophil #: 8.9 10*3/uL — ABNORMAL HIGH (ref 1.4–6.5)
Neutrophil %: 83.3 %
PLATELETS: 159 10*3/uL (ref 150–440)
RBC: 4.3 10*6/uL (ref 3.80–5.20)
RDW: 13.6 % (ref 11.5–14.5)
WBC: 10.7 10*3/uL (ref 3.6–11.0)

## 2013-12-14 LAB — CBC WITH DIFFERENTIAL/PLATELET
BASOS ABS: 0 10*3/uL (ref 0.0–0.1)
Basophil %: 0.3 %
EOS ABS: 0.2 10*3/uL (ref 0.0–0.7)
EOS PCT: 2.5 %
HCT: 34.2 % — AB (ref 35.0–47.0)
HGB: 11.6 g/dL — AB (ref 12.0–16.0)
LYMPHS PCT: 16.7 %
Lymphocyte #: 1.7 10*3/uL (ref 1.0–3.6)
MCH: 26.5 pg (ref 26.0–34.0)
MCHC: 34.1 g/dL (ref 32.0–36.0)
MCV: 78 fL — AB (ref 80–100)
Monocyte #: 0.9 x10 3/mm (ref 0.2–0.9)
Monocyte %: 8.8 %
NEUTROS ABS: 7.2 10*3/uL — AB (ref 1.4–6.5)
Neutrophil %: 71.7 %
Platelet: 190 10*3/uL (ref 150–440)
RBC: 4.39 10*6/uL (ref 3.80–5.20)
RDW: 13.7 % (ref 11.5–14.5)
WBC: 10 10*3/uL (ref 3.6–11.0)

## 2013-12-14 LAB — BASIC METABOLIC PANEL
Anion Gap: 6 — ABNORMAL LOW (ref 7–16)
BUN: 16 mg/dL (ref 7–18)
CHLORIDE: 105 mmol/L (ref 98–107)
Calcium, Total: 8.9 mg/dL (ref 8.5–10.1)
Co2: 25 mmol/L (ref 21–32)
Creatinine: 0.91 mg/dL (ref 0.60–1.30)
Glucose: 76 mg/dL (ref 65–99)
Osmolality: 272 (ref 275–301)
Potassium: 3.4 mmol/L — ABNORMAL LOW (ref 3.5–5.1)
Sodium: 136 mmol/L (ref 136–145)

## 2013-12-14 LAB — VANCOMYCIN, TROUGH: VANCOMYCIN, TROUGH: 15 ug/mL (ref 10–20)

## 2013-12-15 LAB — MAGNESIUM: MAGNESIUM: 2 mg/dL

## 2013-12-15 LAB — CREATININE, SERUM
Creatinine: 0.78 mg/dL (ref 0.60–1.30)
EGFR (African American): >60
EGFR (Non-African Amer.): >60

## 2013-12-15 LAB — VANCOMYCIN, TROUGH: VANCOMYCIN, TROUGH: 16 ug/mL (ref 10–20)

## 2013-12-16 LAB — BASIC METABOLIC PANEL
Anion Gap: 6 — ABNORMAL LOW (ref 7–16)
BUN: 11 mg/dL (ref 7–18)
CALCIUM: 8.3 mg/dL — AB (ref 8.5–10.1)
CHLORIDE: 107 mmol/L (ref 98–107)
CO2: 28 mmol/L (ref 21–32)
CREATININE: 0.78 mg/dL (ref 0.60–1.30)
EGFR (African American): >60
EGFR (Non-African Amer.): >60
Glucose: 82 mg/dL (ref 65–99)
OSMOLALITY: 280 (ref 275–301)
POTASSIUM: 3.9 mmol/L (ref 3.5–5.1)
SODIUM: 141 mmol/L (ref 136–145)

## 2013-12-17 LAB — CULTURE, BLOOD (SINGLE)

## 2013-12-28 ENCOUNTER — Encounter: Payer: Self-pay | Admitting: Cardiothoracic Surgery

## 2013-12-31 LAB — WOUND AEROBIC CULTURE

## 2014-01-09 ENCOUNTER — Encounter: Payer: Self-pay | Admitting: Cardiothoracic Surgery

## 2014-04-01 ENCOUNTER — Ambulatory Visit: Payer: Self-pay | Admitting: Nurse Practitioner

## 2014-06-24 ENCOUNTER — Ambulatory Visit: Payer: Self-pay | Admitting: Anesthesiology

## 2014-06-30 ENCOUNTER — Ambulatory Visit: Payer: Self-pay | Admitting: Gastroenterology

## 2014-07-01 LAB — PATHOLOGY REPORT

## 2015-02-15 ENCOUNTER — Ambulatory Visit: Payer: Self-pay | Admitting: Adult Health

## 2015-04-01 NOTE — H&P (Signed)
PATIENT NAME:  Sharon Boyd, Sharon Boyd MR#:  [id number] DATE OF BIRTH:  15-May-1956  DATE OF ADMISSION:  12/11/2013  PRIMARY CARE PHYSICIAN: Dr. Yves Dill.   EMERGENCY DEPARTMENT REFERRING PHYSICIAN: Dr. York Cerise.   CHIEF COMPLAINT: Bilateral lower extremity swelling, erythema.   HISTORY OF PRESENT ILLNESS: The patient is a 59 year old white female with morbid obesity, with multiple medical problems including hypertension, hyperlipidemia, history of coronary artery disease, chronic hypokalemia, depression, anxiety, peripheral neuropathy, GERD, who states that starting Friday she was not feeling well. She started developing nausea, vomiting and fevers. Then, she started noticing bilateral lower extremity swelling below her knee, extending down all the way to her foot. The patient has had cellulitis, according to her, three times previously and a similar type of rash appears. She has erythema involving both of her legs. It is very warm and tender to her. She was noted to have elevated heart rate on presentation, as well as low blood pressure that dropped to 99/51. I was asked to admit the patient for cellulitis. She also has been feeling nauseous and dizzy. Has had some body aches, but no cough, no congestion. Denies any abdominal pain. No diarrhea or vomiting.   PAST MEDICAL HISTORY: Significant for:  1. Hypertension.  2. Recurrent cellulitis.  3. Hyperlipidemia.  4. Chronic hypokalemia.  5. Depression.  6. Anxiety.  7. Peripheral neuropathy.  8. History of coronary artery disease, details of this are not known.  9. Gastroesophageal reflux disease.   PAST SURGICAL HISTORY: Status post left knee surgery, cholecystectomy, hysterectomy and back surgery.   ALLERGIES: CODEINE, DUST, VICODIN.   CURRENT MEDICATIONS: At home. She is on Butrans transdermal system 10 mcg every seven days, Celebrex 100 mg 1 tab p.o. b.i.d. (Dictation Anomaly) 10  1 tab p.o. b.i.d. as needed, Cymbalta 60 daily, Dymista 137 mcg/50  mcg one spray b.i.d. enalapril hydrochlorothiazide  10/25 mg 1 tab p.o. daily, gemfibrozil  600 1 tab p.o. b.i.d., Klonopin 0.5 mg 1 tab p.o. b.i.d., Klor-Con 10 mEq Monday, Wednesday and Friday, Lasix 40 as needed, Lopressor 100 mg 1 tab p.o. b.i.d., Lyrica 150 mg 1 tab p.o. b.i.d., Norvasc 5 mg 1 tab p.o. daily, ProAir 90 mcg 2 puffs 4 times a day.   SOCIAL HISTORY: Denies smoking, alcohol or drug use.   FAMILY HISTORY: Her father had his first heart attack at age 31, then needed coronary artery bypass.   REVIEW OF SYSTEMS:  CONSTITUTIONAL: Complains of fever, fatigue, weakness, bilateral lower extremity pain. No weight loss. No weight gain.  EYES: No blurred or double vision. No pain. No redness. No inflammation. No glaucoma. No cataracts.  ENT: No tinnitus. No ear pain. No hearing loss. No difficulty swallowing.  RESPIRATORY: Denies any cough, wheezing, hemoptysis. No dyspnea. No TB. No pneumonia.  CARDIOVASCULAR: Denies any chest pain, orthopnea, edema or arrhythmia. No dyspnea on exertion. No palpitation. No syncope.  GASTROINTESTINAL: Complains of some nausea, but no vomiting or diarrhea. No abdominal pain. No hematemesis. No melena. No ulcer. Has GERD.  GENITOURINARY: Denies any dysuria, hematuria, renal calculus or frequency.  ENDOCRINE: Denies any polyuria, nocturia or thyroid problems.  HEMATOLOGIC AND LYMPHATIC: Denies any major bruisability or bleeding.  SKIN: Has bilateral lower extremity erythema as described above.  MUSCULOSKELETAL: Has chronic back pain. NEUROLOGICAL: No numbness. No CVA. No TIA. Has chronic neuropathy.  PSYCHIATRIC: Has anxiety, depression.   PHYSICAL EXAMINATION: VITAL SIGNS: Temperature 97.5, pulse 118, respirations 22, blood pressure 118/73, O2 96%.  GENERAL: The patient is a  morbidly obese female in no acute distress.   HEENT: Head atraumatic, normocephalic. Pupils equally round, reactive to light and accommodation. There is no conjunctival pallor. No  scleral icterus. Nasal exam shows no drainage or ulceration. Nasal exam shows no drainage or ulceration. External ear exam shows no drainage or erythema.  OROPHARYNX: Clear without any exudate.  NECK: Supple without any JVD.  CARDIOVASCULAR: Regular rate and rhythm. No murmurs, rubs, clicks or gallops. PMI is not displaced.  ABDOMEN: Soft, nontender, nondistended. Positive bowel sounds x 4.  EXTREMITIES: No clubbing, cyanosis, or edema.  SKIN: She has got bilateral lower extremity erythema involving both lower extremities from the knee down to her foot, sparing certain areas of her leg.  MUSCULOSKELETAL: There is no erythema or swelling.  NEUROLOGICAL: Awake, alert, oriented x 3. No focal deficits.  PSYCHIATRIC: Not anxious or depressed.   Evaluation in the ED: Glucose 129, BUN 27, creatinine 1.03, sodium 131, potassium 3.4, chloride 98, CO2 was 26, calcium 9.3. LFTs are normal, except a total protein of 8.4, albumin 3.3, bilirubin total 0.7, alkaline phosphatase is 111, AST 28. WBC 14.6, hemoglobin 13.4, platelet count 195.   ASSESSMENT AND PLAN: The patient is a 59 year old white female with history of hypertension, morbid obesity, anxiety, recurrent cellulitis who presents with bilateral lower extremities swelling, erythema.  1. Likely systemic inflammatory response syndrome as a result the bilateral lower extremity cellulitis in light of her elevated WBC count, tachycardia. At this time, we will treat her with IV Unasyn and vancomycin. Follow her blood cultures, which have already been drawn in the ED.  2. Hypotension, likely related to systemic inflammatory response syndrome.  We will hold her blood pressure medications.  3. Hypokalemia. We will her potassium in her IV fluids. Repeat potassium in the morning.  4. Anxiety, depression: Continue, Cymbalta and Klonopin as taking at home.  5. Chronic back pain. We will continue Celebrex.  6. Neuropathy  7. Miscellaneous. We will do Lovenox for  deep vein thrombosis prophylaxis.  Fifty minutes spent on this H and P.     ____________________________ Lacie ScottsShreyang H. Allena KatzPatel, MD shp:sg D: 12/12/2013 10:28:00 ET T: 12/12/2013 11:40:37 ET JOB#: [id number]  cc: Franke Menter H. Allena KatzPatel, MD, <Dictator> Charise CarwinSHREYANG H Francisca Langenderfer MD ELECTRONICALLY SIGNED 12/17/2013 12:45

## 2015-04-01 NOTE — Discharge Summary (Signed)
PATIENT NAME:  Sharon SpruceCLARK, Lyrical F MR#:  [id number] DATE OF BIRTH:  06-30-1956  DATE OF ADMISSION:  12/12/2013 DATE OF DISCHARGE:  12/17/2013  ADMITTING PHYSICIAN: Auburn BilberryShreyang Patel.   DISCHARGING PHYSICIAN: Dr. Ellsworth Lennoxejan-Sie.    DISCHARGE DIAGNOSES:  1. Bilateral lower extremity cellulitis.  2. Hypokalemia.  3. Chronic pain syndrome.  4. Hypertension.   PROCEDURES: None.   IMAGING: None.   DISCHARGE MEDICATIONS: Please refer to medical reconciliation for full list of medications; however, new medications prescribed were clindamycin 300 mg 3 times a day, Augmentin 500 mg b.i.d.   HOSPITAL COURSE: This lady was admitted through the Emergency Room, presenting with bilateral lower extremity cellulitis for which she received outpatient antibiotics without significant improvement. She was admitted to the medical floor, placed on appropriate IV antibiotics, intravenous fluids given the fact that she presented with clinical features of systemic inflammatory response syndrome. The patient responded quite well to the initial therapy, with normalization of her heart rate and improvement of white cell count. This was while she was on Unasyn and vancomycin. Her blood pressure also normalized. Throughout her hospital stay, the patient had recurrent hypokalemia which we supplemented with oral and intravenous potassium. After normalization of her white cell count, the patient continued to have lower extremity blisters and I gave a few more days of intravenous antibiotics before she was discharged to home in satisfactory condition. IV antibiotics changed to clindamycin and Augmentin.   DISCHARGE INSTRUCTIONS: Diet: Low sodium, low fat. Activity as tolerated.   FOLLOWUP: With Dr. Yves DillNeelam Khan in 1 to 2 weeks.   DISCHARGE PROCESS TIME SPENT: 33 minutes.   ____________________________ Silas FloodSheikh A. Ellsworth Lennoxejan-Sie, MD sat:gb D: 01/06/2014 22:41:36 ET T: 01/07/2014 03:54:42 ET JOB#: [id number]  cc: Marland McalpineSheikh A. Ellsworth Lennoxejan-Sie, MD,  <Dictator> Charlesetta GaribaldiSHEIKH A TEJAN-SIE MD ELECTRONICALLY SIGNED 01/09/2014 12:34

## 2016-03-22 ENCOUNTER — Other Ambulatory Visit (HOSPITAL_COMMUNITY): Payer: Self-pay | Admitting: Adult Health

## 2016-03-26 ENCOUNTER — Other Ambulatory Visit (HOSPITAL_COMMUNITY): Payer: Self-pay | Admitting: Adult Health

## 2016-03-26 DIAGNOSIS — I341 Nonrheumatic mitral (valve) prolapse: Secondary | ICD-10-CM

## 2016-03-28 ENCOUNTER — Ambulatory Visit (HOSPITAL_COMMUNITY)
Admission: RE | Admit: 2016-03-28 | Discharge: 2016-03-28 | Disposition: A | Discharge status: Home / Self Care | Payer: Medicare Other | Source: Ambulatory Visit | Attending: Adult Health | Admitting: Adult Health

## 2016-03-28 DIAGNOSIS — E785 Hyperlipidemia, unspecified: Secondary | ICD-10-CM | POA: Insufficient documentation

## 2016-03-28 DIAGNOSIS — I341 Nonrheumatic mitral (valve) prolapse: Secondary | ICD-10-CM | POA: Diagnosis not present

## 2016-03-28 DIAGNOSIS — I119 Hypertensive heart disease without heart failure: Secondary | ICD-10-CM | POA: Diagnosis not present

## 2016-03-28 DIAGNOSIS — I77810 Thoracic aortic ectasia: Secondary | ICD-10-CM | POA: Diagnosis not present

## 2016-03-28 DIAGNOSIS — I2510 Atherosclerotic heart disease of native coronary artery without angina pectoris: Secondary | ICD-10-CM | POA: Insufficient documentation

## 2016-03-28 NOTE — Progress Notes (Signed)
  Echocardiogram 2D Echocardiogram has been performed.  Leta JunglingCooper, Zaley Talley M 03/28/2016, 3:54 PM

## 2017-02-26 ENCOUNTER — Other Ambulatory Visit: Payer: Self-pay | Admitting: Pain Medicine

## 2017-02-26 DIAGNOSIS — M961 Postlaminectomy syndrome, not elsewhere classified: Secondary | ICD-10-CM

## 2017-02-26 DIAGNOSIS — M47816 Spondylosis without myelopathy or radiculopathy, lumbar region: Secondary | ICD-10-CM

## 2017-02-27 ENCOUNTER — Other Ambulatory Visit: Payer: Self-pay | Admitting: Pain Medicine

## 2017-02-27 DIAGNOSIS — M47896 Other spondylosis, lumbar region: Secondary | ICD-10-CM

## 2017-02-27 DIAGNOSIS — M79606 Pain in leg, unspecified: Secondary | ICD-10-CM

## 2017-02-27 DIAGNOSIS — M545 Low back pain: Secondary | ICD-10-CM

## 2017-03-11 ENCOUNTER — Ambulatory Visit: Payer: Medicare Other

## 2017-10-17 ENCOUNTER — Other Ambulatory Visit: Payer: Self-pay | Admitting: Adult Health

## 2017-10-17 DIAGNOSIS — Z1231 Encounter for screening mammogram for malignant neoplasm of breast: Secondary | ICD-10-CM

## 2018-08-20 ENCOUNTER — Other Ambulatory Visit: Payer: Self-pay | Admitting: Adult Health

## 2018-08-20 DIAGNOSIS — Z1231 Encounter for screening mammogram for malignant neoplasm of breast: Secondary | ICD-10-CM

## 2018-08-20 DIAGNOSIS — M25562 Pain in left knee: Secondary | ICD-10-CM

## 2018-08-21 ENCOUNTER — Ambulatory Visit
Admission: RE | Admit: 2018-08-21 | Discharge: 2018-08-21 | Disposition: A | Discharge status: Home / Self Care | Payer: Medicare Other | Source: Ambulatory Visit | Attending: Adult Health | Admitting: Adult Health

## 2018-08-21 DIAGNOSIS — M1712 Unilateral primary osteoarthritis, left knee: Secondary | ICD-10-CM | POA: Insufficient documentation

## 2018-08-21 DIAGNOSIS — M25562 Pain in left knee: Secondary | ICD-10-CM

## 2019-08-06 ENCOUNTER — Other Ambulatory Visit: Payer: Self-pay

## 2019-08-06 ENCOUNTER — Emergency Department: Payer: Medicare Other

## 2019-08-06 ENCOUNTER — Encounter: Payer: Self-pay | Admitting: Emergency Medicine

## 2019-08-06 ENCOUNTER — Emergency Department
Admission: EM | Admit: 2019-08-06 | Discharge: 2019-08-06 | Disposition: A | Discharge status: Home / Self Care | Payer: Medicare Other | Attending: Emergency Medicine | Admitting: Emergency Medicine

## 2019-08-06 DIAGNOSIS — Y999 Unspecified external cause status: Secondary | ICD-10-CM | POA: Insufficient documentation

## 2019-08-06 DIAGNOSIS — S161XXA Strain of muscle, fascia and tendon at neck level, initial encounter: Secondary | ICD-10-CM | POA: Diagnosis not present

## 2019-08-06 DIAGNOSIS — M7918 Myalgia, other site: Secondary | ICD-10-CM | POA: Diagnosis not present

## 2019-08-06 DIAGNOSIS — Y939 Activity, unspecified: Secondary | ICD-10-CM | POA: Insufficient documentation

## 2019-08-06 DIAGNOSIS — I10 Essential (primary) hypertension: Secondary | ICD-10-CM | POA: Diagnosis not present

## 2019-08-06 DIAGNOSIS — Y92410 Unspecified street and highway as the place of occurrence of the external cause: Secondary | ICD-10-CM | POA: Insufficient documentation

## 2019-08-06 DIAGNOSIS — R0782 Intercostal pain: Secondary | ICD-10-CM | POA: Diagnosis not present

## 2019-08-06 DIAGNOSIS — G44319 Acute post-traumatic headache, not intractable: Secondary | ICD-10-CM | POA: Insufficient documentation

## 2019-08-06 HISTORY — DX: Essential (primary) hypertension: I10

## 2019-08-06 HISTORY — DX: Unspecified osteoarthritis, unspecified site: M19.90

## 2019-08-06 HISTORY — DX: Fibromyalgia: M79.7

## 2019-08-06 LAB — CBC WITH DIFFERENTIAL/PLATELET
Abs Immature Granulocytes: 0.01 10*3/uL (ref 0.00–0.07)
Basophils Absolute: 0 10*3/uL (ref 0.0–0.1)
Basophils Relative: 1 %
Eosinophils Absolute: 0.1 10*3/uL (ref 0.0–0.5)
Eosinophils Relative: 2 %
HCT: 35 % — ABNORMAL LOW (ref 36.0–46.0)
Hemoglobin: 11.2 g/dL — ABNORMAL LOW (ref 12.0–15.0)
Immature Granulocytes: 0 %
Lymphocytes Relative: 26 %
Lymphs Abs: 1.4 10*3/uL (ref 0.7–4.0)
MCH: 26.9 pg (ref 26.0–34.0)
MCHC: 32 g/dL (ref 30.0–36.0)
MCV: 84.1 fL (ref 80.0–100.0)
Monocytes Absolute: 0.4 10*3/uL (ref 0.1–1.0)
Monocytes Relative: 8 %
Neutro Abs: 3.3 10*3/uL (ref 1.7–7.7)
Neutrophils Relative %: 63 %
Platelets: 161 10*3/uL (ref 150–400)
RBC: 4.16 MIL/uL (ref 3.87–5.11)
RDW: 13.5 % (ref 11.5–15.5)
WBC: 5.2 10*3/uL (ref 4.0–10.5)
nRBC: 0 % (ref 0.0–0.2)

## 2019-08-06 LAB — BASIC METABOLIC PANEL
Anion gap: 5 (ref 5–15)
BUN: 25 mg/dL — ABNORMAL HIGH (ref 8–23)
CO2: 27 mmol/L (ref 22–32)
Calcium: 8.7 mg/dL — ABNORMAL LOW (ref 8.9–10.3)
Chloride: 110 mmol/L (ref 98–111)
Creatinine, Ser: 1.16 mg/dL — ABNORMAL HIGH (ref 0.44–1.00)
GFR calc Af Amer: 59 mL/min — ABNORMAL LOW (ref >60)
GFR calc non Af Amer: 51 mL/min — ABNORMAL LOW (ref >60)
Glucose, Bld: 95 mg/dL (ref 70–99)
Potassium: 3.8 mmol/L (ref 3.5–5.1)
Sodium: 142 mmol/L (ref 135–145)

## 2019-08-06 MED ORDER — CYCLOBENZAPRINE HCL 10 MG PO TABS
10.0000 mg | ORAL_TABLET | Freq: Once | ORAL | Status: AC
  Administered 2019-08-06: 11:00:00 10 mg via ORAL
  Filled 2019-08-06: qty 1

## 2019-08-06 MED ORDER — TRAMADOL HCL 50 MG PO TABS
50.0000 mg | ORAL_TABLET | Freq: Once | ORAL | Status: AC
  Administered 2019-08-06: 50 mg via ORAL
  Filled 2019-08-06: qty 1

## 2019-08-06 MED ORDER — IBUPROFEN 600 MG PO TABS
600.0000 mg | ORAL_TABLET | Freq: Once | ORAL | Status: AC
  Administered 2019-08-06: 600 mg via ORAL
  Filled 2019-08-06: qty 1
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
## 2019-09-20 ENCOUNTER — Other Ambulatory Visit: Payer: Self-pay | Admitting: Adult Health

## 2019-09-20 DIAGNOSIS — N644 Mastodynia: Secondary | ICD-10-CM

## 2019-09-28 ENCOUNTER — Ambulatory Visit
Admission: RE | Admit: 2019-09-28 | Discharge: 2019-09-28 | Disposition: A | Discharge status: Home / Self Care | Payer: Medicare Other | Source: Ambulatory Visit | Attending: *Deleted | Admitting: *Deleted

## 2019-09-28 ENCOUNTER — Ambulatory Visit
Admission: RE | Admit: 2019-09-28 | Discharge: 2019-09-28 | Disposition: A | Discharge status: Home / Self Care | Payer: Medicare Other | Source: Ambulatory Visit | Attending: Adult Health | Admitting: Adult Health

## 2019-09-28 DIAGNOSIS — N644 Mastodynia: Secondary | ICD-10-CM

## 2019-09-28 DIAGNOSIS — Z1231 Encounter for screening mammogram for malignant neoplasm of breast: Secondary | ICD-10-CM | POA: Insufficient documentation

## 2021-02-18 ENCOUNTER — Emergency Department: Payer: Medicare Other

## 2021-02-18 ENCOUNTER — Emergency Department
Admission: EM | Admit: 2021-02-18 | Discharge: 2021-02-18 | Disposition: A | Discharge status: Home / Self Care | Payer: Medicare Other | Attending: Emergency Medicine | Admitting: Emergency Medicine

## 2021-02-18 ENCOUNTER — Encounter: Payer: Self-pay | Admitting: Radiology

## 2021-02-18 ENCOUNTER — Other Ambulatory Visit: Payer: Self-pay

## 2021-02-18 DIAGNOSIS — M25561 Pain in right knee: Secondary | ICD-10-CM | POA: Diagnosis not present

## 2021-02-18 DIAGNOSIS — M25512 Pain in left shoulder: Secondary | ICD-10-CM | POA: Diagnosis present

## 2021-02-18 DIAGNOSIS — I10 Essential (primary) hypertension: Secondary | ICD-10-CM | POA: Insufficient documentation

## 2021-02-18 DIAGNOSIS — Y92410 Unspecified street and highway as the place of occurrence of the external cause: Secondary | ICD-10-CM | POA: Insufficient documentation

## 2021-02-18 DIAGNOSIS — M25562 Pain in left knee: Secondary | ICD-10-CM | POA: Diagnosis not present
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
## 2021-12-23 ENCOUNTER — Inpatient Hospital Stay: Payer: Medicare Other

## 2021-12-23 ENCOUNTER — Inpatient Hospital Stay
Admission: EM | Admit: 2021-12-23 | Discharge: 2021-12-27 | DRG: 871 | Disposition: A | Discharge status: Home / Self Care | Payer: Medicare Other | Attending: Student | Admitting: Student

## 2021-12-23 ENCOUNTER — Encounter: Payer: Self-pay | Admitting: Emergency Medicine

## 2021-12-23 ENCOUNTER — Other Ambulatory Visit: Payer: Self-pay

## 2021-12-23 ENCOUNTER — Emergency Department: Payer: Medicare Other

## 2021-12-23 DIAGNOSIS — R197 Diarrhea, unspecified: Secondary | ICD-10-CM | POA: Diagnosis present

## 2021-12-23 DIAGNOSIS — M542 Cervicalgia: Secondary | ICD-10-CM | POA: Diagnosis present

## 2021-12-23 DIAGNOSIS — G894 Chronic pain syndrome: Secondary | ICD-10-CM | POA: Diagnosis present

## 2021-12-23 DIAGNOSIS — E875 Hyperkalemia: Secondary | ICD-10-CM | POA: Diagnosis present

## 2021-12-23 DIAGNOSIS — E872 Acidosis, unspecified: Secondary | ICD-10-CM | POA: Diagnosis present

## 2021-12-23 DIAGNOSIS — E039 Hypothyroidism, unspecified: Secondary | ICD-10-CM | POA: Diagnosis present

## 2021-12-23 DIAGNOSIS — Z6841 Body Mass Index (BMI) 40.0 and over, adult: Secondary | ICD-10-CM | POA: Diagnosis not present

## 2021-12-23 DIAGNOSIS — I959 Hypotension, unspecified: Secondary | ICD-10-CM | POA: Diagnosis not present

## 2021-12-23 DIAGNOSIS — N281 Cyst of kidney, acquired: Secondary | ICD-10-CM | POA: Diagnosis present

## 2021-12-23 DIAGNOSIS — Z9049 Acquired absence of other specified parts of digestive tract: Secondary | ICD-10-CM

## 2021-12-23 DIAGNOSIS — N1832 Chronic kidney disease, stage 3b: Secondary | ICD-10-CM | POA: Diagnosis present

## 2021-12-23 DIAGNOSIS — E785 Hyperlipidemia, unspecified: Secondary | ICD-10-CM | POA: Diagnosis present

## 2021-12-23 DIAGNOSIS — Z20822 Contact with and (suspected) exposure to covid-19: Secondary | ICD-10-CM | POA: Diagnosis present

## 2021-12-23 DIAGNOSIS — R531 Weakness: Secondary | ICD-10-CM | POA: Diagnosis present

## 2021-12-23 DIAGNOSIS — M1990 Unspecified osteoarthritis, unspecified site: Secondary | ICD-10-CM | POA: Diagnosis present

## 2021-12-23 DIAGNOSIS — R571 Hypovolemic shock: Secondary | ICD-10-CM | POA: Diagnosis present

## 2021-12-23 DIAGNOSIS — Z90710 Acquired absence of both cervix and uterus: Secondary | ICD-10-CM

## 2021-12-23 DIAGNOSIS — G9341 Metabolic encephalopathy: Secondary | ICD-10-CM | POA: Diagnosis present

## 2021-12-23 DIAGNOSIS — J449 Chronic obstructive pulmonary disease, unspecified: Secondary | ICD-10-CM | POA: Diagnosis present

## 2021-12-23 DIAGNOSIS — Z79899 Other long term (current) drug therapy: Secondary | ICD-10-CM

## 2021-12-23 DIAGNOSIS — N179 Acute kidney failure, unspecified: Secondary | ICD-10-CM | POA: Diagnosis present

## 2021-12-23 DIAGNOSIS — Z79891 Long term (current) use of opiate analgesic: Secondary | ICD-10-CM

## 2021-12-23 DIAGNOSIS — F32A Depression, unspecified: Secondary | ICD-10-CM | POA: Diagnosis present

## 2021-12-23 DIAGNOSIS — M797 Fibromyalgia: Secondary | ICD-10-CM | POA: Diagnosis present

## 2021-12-23 DIAGNOSIS — G629 Polyneuropathy, unspecified: Secondary | ICD-10-CM | POA: Diagnosis present

## 2021-12-23 DIAGNOSIS — I129 Hypertensive chronic kidney disease with stage 1 through stage 4 chronic kidney disease, or unspecified chronic kidney disease: Secondary | ICD-10-CM | POA: Diagnosis present

## 2021-12-23 DIAGNOSIS — A419 Sepsis, unspecified organism: Secondary | ICD-10-CM | POA: Diagnosis present

## 2021-12-23 DIAGNOSIS — Z7951 Long term (current) use of inhaled steroids: Secondary | ICD-10-CM

## 2021-12-23 DIAGNOSIS — I9589 Other hypotension: Secondary | ICD-10-CM | POA: Diagnosis not present

## 2021-12-23 DIAGNOSIS — K219 Gastro-esophageal reflux disease without esophagitis: Secondary | ICD-10-CM | POA: Diagnosis present

## 2021-12-23 DIAGNOSIS — F419 Anxiety disorder, unspecified: Secondary | ICD-10-CM | POA: Diagnosis present

## 2021-12-23 DIAGNOSIS — M549 Dorsalgia, unspecified: Secondary | ICD-10-CM | POA: Diagnosis present

## 2021-12-23 DIAGNOSIS — G4733 Obstructive sleep apnea (adult) (pediatric): Secondary | ICD-10-CM | POA: Diagnosis present

## 2021-12-23 LAB — CBC WITH DIFFERENTIAL/PLATELET
Abs Immature Granulocytes: 0.01 10*3/uL (ref 0.00–0.07)
Basophils Absolute: 0 10*3/uL (ref 0.0–0.1)
Basophils Relative: 1 %
Eosinophils Absolute: 0.1 10*3/uL (ref 0.0–0.5)
Eosinophils Relative: 2 %
HCT: 31.3 % — ABNORMAL LOW (ref 36.0–46.0)
Hemoglobin: 9.9 g/dL — ABNORMAL LOW (ref 12.0–15.0)
Immature Granulocytes: 0 %
Lymphocytes Relative: 31 %
Lymphs Abs: 1.5 10*3/uL (ref 0.7–4.0)
MCH: 27.6 pg (ref 26.0–34.0)
MCHC: 31.6 g/dL (ref 30.0–36.0)
MCV: 87.2 fL (ref 80.0–100.0)
Monocytes Absolute: 0.4 10*3/uL (ref 0.1–1.0)
Monocytes Relative: 8 %
Neutro Abs: 2.8 10*3/uL (ref 1.7–7.7)
Neutrophils Relative %: 58 %
Platelets: 196 10*3/uL (ref 150–400)
RBC: 3.59 MIL/uL — ABNORMAL LOW (ref 3.87–5.11)
RDW: 14.6 % (ref 11.5–15.5)
WBC: 4.8 10*3/uL (ref 4.0–10.5)
nRBC: 0 % (ref 0.0–0.2)

## 2021-12-23 LAB — COMPREHENSIVE METABOLIC PANEL
ALT: 11 U/L (ref 0–44)
ALT: 10 U/L (ref 0–44)
AST: 14 U/L — ABNORMAL LOW (ref 15–41)
AST: 13 U/L — ABNORMAL LOW (ref 15–41)
Albumin: 3.8 g/dL (ref 3.5–5.0)
Albumin: 3.2 g/dL — ABNORMAL LOW (ref 3.5–5.0)
Alkaline Phosphatase: 75 U/L (ref 38–126)
Alkaline Phosphatase: 63 U/L (ref 38–126)
Anion gap: 8 (ref 5–15)
Anion gap: 5 (ref 5–15)
BUN: 95 mg/dL — ABNORMAL HIGH (ref 8–23)
BUN: 84 mg/dL — ABNORMAL HIGH (ref 8–23)
CO2: 18 mmol/L — ABNORMAL LOW (ref 22–32)
CO2: 17 mmol/L — ABNORMAL LOW (ref 22–32)
Calcium: 8.8 mg/dL — ABNORMAL LOW (ref 8.9–10.3)
Calcium: 7.9 mg/dL — ABNORMAL LOW (ref 8.9–10.3)
Chloride: 114 mmol/L — ABNORMAL HIGH (ref 98–111)
Chloride: 120 mmol/L — ABNORMAL HIGH (ref 98–111)
Creatinine, Ser: 5.13 mg/dL — ABNORMAL HIGH (ref 0.44–1.00)
Creatinine, Ser: 4.55 mg/dL — ABNORMAL HIGH (ref 0.44–1.00)
GFR, Estimated: 9 mL/min — ABNORMAL LOW (ref >60)
GFR, Estimated: 10 mL/min — ABNORMAL LOW (ref >60)
Glucose, Bld: 96 mg/dL (ref 70–99)
Glucose, Bld: 105 mg/dL — ABNORMAL HIGH (ref 70–99)
Potassium: 5.8 mmol/L — ABNORMAL HIGH (ref 3.5–5.1)
Potassium: 6 mmol/L — ABNORMAL HIGH (ref 3.5–5.1)
Sodium: 140 mmol/L (ref 135–145)
Sodium: 142 mmol/L (ref 135–145)
Total Bilirubin: 0.4 mg/dL (ref 0.3–1.2)
Total Bilirubin: 0.8 mg/dL (ref 0.3–1.2)
Total Protein: 6.8 g/dL (ref 6.5–8.1)
Total Protein: 5.5 g/dL — ABNORMAL LOW (ref 6.5–8.1)

## 2021-12-23 LAB — LIPASE, BLOOD: Lipase: 34 U/L (ref 11–51)

## 2021-12-23 LAB — RESP PANEL BY RT-PCR (FLU A&B, COVID) ARPGX2
Influenza A by PCR: NEGATIVE
Influenza B by PCR: NEGATIVE
SARS Coronavirus 2 by RT PCR: NEGATIVE

## 2021-12-23 LAB — BLOOD GAS, VENOUS
Acid-base deficit: 9.6 mmol/L — ABNORMAL HIGH (ref 0.0–2.0)
Bicarbonate: 18 mmol/L — ABNORMAL LOW (ref 20.0–28.0)
O2 Saturation: 78.1 %
Patient temperature: 37
pCO2, Ven: 46 mmHg (ref 44.0–60.0)
pH, Ven: 7.2 — ABNORMAL LOW (ref 7.250–7.430)
pO2, Ven: 53 mmHg — ABNORMAL HIGH (ref 32.0–45.0)

## 2021-12-23 LAB — URINALYSIS, ROUTINE W REFLEX MICROSCOPIC
Bilirubin Urine: NEGATIVE
Glucose, UA: NEGATIVE mg/dL
Hgb urine dipstick: NEGATIVE
Ketones, ur: NEGATIVE mg/dL
Leukocytes,Ua: NEGATIVE
Nitrite: NEGATIVE
Protein, ur: NEGATIVE mg/dL
Specific Gravity, Urine: 1.025 (ref 1.005–1.030)
pH: 5.5 (ref 5.0–8.0)

## 2021-12-23 LAB — GLUCOSE, CAPILLARY: Glucose-Capillary: 80 mg/dL (ref 70–99)

## 2021-12-23 LAB — CBC
HCT: 31.4 % — ABNORMAL LOW (ref 36.0–46.0)
Hemoglobin: 9.9 g/dL — ABNORMAL LOW (ref 12.0–15.0)
MCH: 27.4 pg (ref 26.0–34.0)
MCHC: 31.5 g/dL (ref 30.0–36.0)
MCV: 87 fL (ref 80.0–100.0)
Platelets: 198 10*3/uL (ref 150–400)
RBC: 3.61 MIL/uL — ABNORMAL LOW (ref 3.87–5.11)
RDW: 14.5 % (ref 11.5–15.5)
WBC: 4.7 10*3/uL (ref 4.0–10.5)
nRBC: 0 % (ref 0.0–0.2)

## 2021-12-23 LAB — TYPE AND SCREEN
ABO/RH(D): O POS
Antibody Screen: NEGATIVE

## 2021-12-23 LAB — APTT: aPTT: 34 seconds (ref 24–36)

## 2021-12-23 LAB — PROTIME-INR
INR: 1.2 (ref 0.8–1.2)
Prothrombin Time: 15.1 seconds (ref 11.4–15.2)

## 2021-12-23 LAB — AMMONIA: Ammonia: 30 umol/L (ref 9–35)

## 2021-12-23 LAB — D-DIMER, QUANTITATIVE: D-Dimer, Quant: 2.63 ug/mL-FEU — ABNORMAL HIGH (ref 0.00–0.50)

## 2021-12-23 LAB — LACTIC ACID, PLASMA
Lactic Acid, Venous: 1.1 mmol/L (ref 0.5–1.9)
Lactic Acid, Venous: 1.4 mmol/L (ref 0.5–1.9)

## 2021-12-23 LAB — HIV ANTIBODY (ROUTINE TESTING W REFLEX): HIV Screen 4th Generation wRfx: NONREACTIVE

## 2021-12-23 LAB — TSH: TSH: 0.716 u[IU]/mL (ref 0.350–4.500)

## 2021-12-23 LAB — CORTISOL: Cortisol, Plasma: 5.4 ug/dL

## 2021-12-23 LAB — TROPONIN I (HIGH SENSITIVITY)
Troponin I (High Sensitivity): 3 ng/L (ref <18)
Troponin I (High Sensitivity): 3 ng/L (ref <18)

## 2021-12-23 LAB — T4, FREE: Free T4: 0.95 ng/dL (ref 0.61–1.12)

## 2021-12-23 LAB — MRSA NEXT GEN BY PCR, NASAL: MRSA by PCR Next Gen: DETECTED — AB

## 2021-12-23 LAB — BRAIN NATRIURETIC PEPTIDE: B Natriuretic Peptide: 174.4 pg/mL — ABNORMAL HIGH (ref 0.0–100.0)

## 2021-12-23 LAB — PROCALCITONIN: Procalcitonin: <0.1 ng/mL

## 2021-12-23 MED ORDER — SODIUM CHLORIDE 0.9 % IV SOLN
250.0000 mL | INTRAVENOUS | Status: DC
  Administered 2021-12-23: 250 mL via INTRAVENOUS

## 2021-12-23 MED ORDER — NOREPINEPHRINE 4 MG/250ML-% IV SOLN: 0.0000 ug/min | INTRAVENOUS | Status: DC

## 2021-12-23 MED ORDER — DOCUSATE SODIUM 100 MG PO CAPS: 100.0000 mg | ORAL_CAPSULE | Freq: Two times a day (BID) | ORAL | Status: DC | PRN

## 2021-12-23 MED ORDER — SODIUM CHLORIDE 0.9 % IV SOLN
2.0000 g | INTRAVENOUS | Status: DC
  Filled 2021-12-23: qty 2

## 2021-12-23 MED ORDER — SODIUM CHLORIDE 0.9 % IV SOLN
2.0000 g | Freq: Once | INTRAVENOUS | Status: AC
  Administered 2021-12-23: 2 g via INTRAVENOUS
  Filled 2021-12-23: qty 2

## 2021-12-23 MED ORDER — INFLUENZA VAC SPLIT QUAD 0.5 ML IM SUSY: 0.5000 mL | PREFILLED_SYRINGE | INTRAMUSCULAR | Status: DC

## 2021-12-23 MED ORDER — SODIUM CHLORIDE 0.9 % IV SOLN: 500.0000 mg | INTRAVENOUS | Status: DC

## 2021-12-23 MED ORDER — SODIUM CHLORIDE 0.9 % IV BOLUS
1000.0000 mL | Freq: Once | INTRAVENOUS | Status: AC
  Administered 2021-12-23: 1000 mL via INTRAVENOUS

## 2021-12-23 MED ORDER — ORAL CARE MOUTH RINSE
15.0000 mL | Freq: Two times a day (BID) | OROMUCOSAL | Status: DC
  Administered 2021-12-23 – 2021-12-27 (×6): 15 mL via OROMUCOSAL

## 2021-12-23 MED ORDER — SODIUM ZIRCONIUM CYCLOSILICATE 5 G PO PACK
10.0000 g | PACK | Freq: Once | ORAL | Status: AC
  Administered 2021-12-23: 10 g via ORAL
  Filled 2021-12-23: qty 2

## 2021-12-23 MED ORDER — NOREPINEPHRINE 4 MG/250ML-% IV SOLN
INTRAVENOUS | Status: AC
  Administered 2021-12-23: 2 ug/min via INTRAVENOUS
  Filled 2021-12-23: qty 250

## 2021-12-23 MED ORDER — NOREPINEPHRINE 4 MG/250ML-% IV SOLN
2.0000 ug/min | INTRAVENOUS | Status: DC
  Administered 2021-12-23: 2 ug/min via INTRAVENOUS
  Administered 2021-12-23: 4 ug/min via INTRAVENOUS

## 2021-12-23 MED ORDER — INSULIN ASPART 100 UNIT/ML IV SOLN
10.0000 [IU] | Freq: Once | INTRAVENOUS | Status: AC
  Administered 2021-12-23: 10 [IU] via INTRAVENOUS
  Filled 2021-12-23: qty 0.1

## 2021-12-23 MED ORDER — SODIUM BICARBONATE 8.4 % IV SOLN
Freq: Once | INTRAVENOUS | Status: AC
  Filled 2021-12-23: qty 150

## 2021-12-23 MED ORDER — VANCOMYCIN HCL IN DEXTROSE 1-5 GM/200ML-% IV SOLN
1000.0000 mg | Freq: Once | INTRAVENOUS | Status: AC
  Administered 2021-12-23: 1000 mg via INTRAVENOUS
  Filled 2021-12-23 (×2): qty 200

## 2021-12-23 MED ORDER — METRONIDAZOLE 500 MG/100ML IV SOLN
500.0000 mg | Freq: Two times a day (BID) | INTRAVENOUS | Status: DC
  Administered 2021-12-23 – 2021-12-24 (×2): 500 mg via INTRAVENOUS
  Filled 2021-12-23 (×3): qty 100

## 2021-12-23 MED ORDER — LACTATED RINGERS IV BOLUS (SEPSIS)
500.0000 mL | Freq: Once | INTRAVENOUS | Status: AC
  Administered 2021-12-23: 500 mL via INTRAVENOUS

## 2021-12-23 MED ORDER — DEXTROSE 50 % IV SOLN
1.0000 | Freq: Once | INTRAVENOUS | Status: AC
  Administered 2021-12-23: 50 mL via INTRAVENOUS
  Filled 2021-12-23: qty 50

## 2021-12-23 MED ORDER — POLYETHYLENE GLYCOL 3350 17 G PO PACK
17.0000 g | PACK | Freq: Every day | ORAL | Status: DC | PRN
Start: 2021-12-23 — End: 2021-12-27

## 2021-12-23 MED ORDER — VANCOMYCIN HCL 1500 MG/300ML IV SOLN
1500.0000 mg | Freq: Once | INTRAVENOUS | Status: AC
  Administered 2021-12-23: 1500 mg via INTRAVENOUS
  Filled 2021-12-23: qty 300

## 2021-12-23 MED ORDER — SODIUM CHLORIDE 0.9 % IV SOLN: 2.0000 g | INTRAVENOUS | Status: DC

## 2021-12-23 MED ORDER — ALBUTEROL SULFATE (2.5 MG/3ML) 0.083% IN NEBU
10.0000 mg | INHALATION_SOLUTION | Freq: Once | RESPIRATORY_TRACT | Status: AC
  Administered 2021-12-23: 10 mg via RESPIRATORY_TRACT
  Filled 2021-12-23: qty 12

## 2021-12-23 NOTE — ED Provider Notes (Signed)
[facility] Provider Note    Event Date/Time   First MD Initiated Contact with Patient 12/23/21 1105     (approximate)   History   Abnormal Lab   HPI  Sharon Boyd is a 66 y.o. female sent from the pain clinic because when they drew her blood today they noted her creatinine and potassium are markedly elevated.  She comes in here feeling groggy.  She has not taken any more than her usual dose of pain medications.  Her pupils are not pinpoint.  Blood pressure was noted to be low and actually drops in the emergency room.  After 2 L of IV fluid her pressure still low.  Her GFR comes back at 9 which is a marked change from 2 years ago which is the last one that I have.  We will give her more fluids currently.  She is denying any chest pain or tightness or shortness of breath or belly pain.  She reports she has had diarrhea for about 2 weeks.  She did feel little lightheaded.      Physical Exam   Triage Vital Signs: ED Triage Vitals  Enc Vitals Group     BP 12/23/21 1045 (!) 87/60     Pulse Rate 12/23/21 1045 69     Resp 12/23/21 1045 20     Temp 12/23/21 1045 (!) 97.5 F (36.4 C)     Temp Source 12/23/21 1045 Oral     SpO2 12/23/21 1045 95 %     Weight 12/23/21 1036 (!) 313 lb 0.9 oz (142 kg)     Height 12/23/21 1036 5\' 9"  (1.753 m)     Head Circumference --      Peak Flow --      Pain Score 12/23/21 1036 10     Pain Loc --      Pain Edu? --      Excl. in GC? --     Most recent vital signs: Vitals:   12/23/21 1300 12/23/21 1315  BP: (!) 156/135 (!) 237/222  Pulse: 67 62  Resp:    Temp:    SpO2:      { General: Awake, no distress.  Somewhat groggy though.  Patient says she feels like she took some pain medication but she did not. Eyes: Pupils equal round reactive not pinpoint CV:  Good peripheral perfusion.  Heart regular rate and rhythm no audible murmur Resp:  Normal effort.  No retractions lungs are clear Abd:  No distention.  Not  tender    ED Results / Procedures / Treatments   Labs (all labs ordered are listed, but only abnormal results are displayed) Labs Reviewed  COMPREHENSIVE METABOLIC PANEL - Abnormal; Notable for the following components:      Result Value   Potassium 5.8 (*)    Chloride 114 (*)    CO2 18 (*)    BUN 95 (*)    Creatinine, Ser 5.13 (*)    Calcium 8.8 (*)    AST 14 (*)    GFR, Estimated 9 (*)    All other components within normal limits  CBC - Abnormal; Notable for the following components:   RBC 3.61 (*)    Hemoglobin 9.9 (*)    HCT 31.4 (*)    All other components within normal limits  BLOOD GAS, VENOUS - Abnormal; Notable for the following components:   pH, Ven 7.20 (*)    pO2, Ven 53.0 (*)  Bicarbonate 18.0 (*)    Acid-base deficit 9.6 (*)    All other components within normal limits  CBC WITH DIFFERENTIAL/PLATELET - Abnormal; Notable for the following components:   RBC 3.59 (*)    Hemoglobin 9.9 (*)    HCT 31.3 (*)    All other components within normal limits  RESP PANEL BY RT-PCR (FLU A&B, COVID) ARPGX2  CULTURE, BLOOD (ROUTINE X 2)  CULTURE, BLOOD (ROUTINE X 2)  URINE CULTURE  LIPASE, BLOOD  LACTIC ACID, PLASMA  URINALYSIS, ROUTINE W REFLEX MICROSCOPIC  LACTIC ACID, PLASMA  PROCALCITONIN  BRAIN NATRIURETIC PEPTIDE  HIV ANTIBODY (ROUTINE TESTING W REFLEX)  CORTISOL  PROTIME-INR  APTT  D-DIMER, QUANTITATIVE  LEGIONELLA PNEUMOPHILA SEROGP 1 UR AG  STREP PNEUMONIAE URINARY ANTIGEN  URINE DRUG SCREEN, QUALITATIVE ([facility] ONLY)  TSH  T4, FREE  TYPE AND SCREEN  TROPONIN I (HIGH SENSITIVITY)     EKG  EKG read interpreted by me shows normal sinus rhythm rate of 69 normal axis no acute disease.   RADIOLOGY  Chest x-ray read by radiology reviewed by me shows either fluid overload or atypical pneumonia.  PROCEDURES:  Critical Care performed: Critical care time 45 minutes.  This includes going in and out of the room supervising the fluids as they are going  and reviewing her old medical history and old labs.  I also spoke with the intensive care doctor and the intensive care NP.  I reviewed the case with both practitioners started the Levophed and stopped it as the patient's blood pressure came back quite high almost immediately.  Procedures   MEDICATIONS ORDERED IN ED: Medications  sodium bicarbonate 150 mEq in dextrose 5 % 1,150 mL infusion (has no administration in time range)  docusate sodium (COLACE) capsule 100 mg (has no administration in time range)  polyethylene glycol (MIRALAX / GLYCOLAX) packet 17 g (has no administration in time range)  lactated ringers bolus 500 mL (has no administration in time range)  cefTRIAXone (ROCEPHIN) 2 g in sodium chloride 0.9 % 100 mL IVPB (has no administration in time range)  azithromycin (ZITHROMAX) 500 mg in sodium chloride 0.9 % 250 mL IVPB (has no administration in time range)  0.9 %  sodium chloride infusion (has no administration in time range)  norepinephrine (LEVOPHED) 4mg  in (0.016 mg/mL) premix infusion (has no administration in time range)  sodium chloride 0.9 % bolus 1,000 mL (0 mLs Intravenous Stopped 12/23/21 1306)  sodium chloride 0.9 % bolus 1,000 mL (0 mLs Intravenous Stopped 12/23/21 1306)  sodium chloride 0.9 % bolus 1,000 mL (0 mLs Intravenous Stopped 12/23/21 1213)  sodium chloride 0.9 % bolus 1,000 mL (0 mLs Intravenous Stopped 12/23/21 1233)     IMPRESSION / MDM / ASSESSMENT AND PLAN / ED COURSE  I reviewed the triage vital signs and the nursing notes.             ----------------------------------------- 1:03 PM on 12/23/2021 -----------------------------------------                 Because patient's blood pressure is very low and has finally begin to come up with some IV fluids I do not believe she is having fluid overload although it is possible.  We will get a BNP and a BMP to see what is going on.  Hopefully her creatinine and potassium have come down some.  Patient's  chest x-ray could be due to fluid overload but this is unlikely because her blood pressure was so low and she  had been dehydrated based on her hyperkalemia and renal findings and history of diarrhea and limited p.o. intake.  More likely she is having a inflammatory condition in the lungs possibly even COVID.  This test is still pending.  I have reviewed her chest x-ray myself.  Additionally I have reviewed all of her labs and her past medical history with labs and white counts and renal function.  As mentioned immediately above I discussed her care in detail with the intensivist and the intensive care nurse practitioner.  I also discussed the patient with the patient's sister who has come for a visit.  Patient herself is unchanged as far as her mental status she is still a little bit groggy and feels sleepy she still does not have pinpoint pupils.  She is however awake and talking and making perfect sense.  Blood pressure went from a low of systolic in the 50s to a high of 220 when we shut off the Levophed.  The change in her blood pressure was really quite incredible.  Patient had been maintained on the cardiac monitor and pulse ox during her visit.  No arrhythmias were seen.  She did require some oxygen and is now on 2 L.  ICU will admit her there.  We will monitor her closely and see if we can determine the cause of her hypotension.  She is still not having abdominal pain.          FINAL CLINICAL IMPRESSION(S) / ED DIAGNOSES   Final diagnoses:  AKI (acute kidney injury) (HCC)  Serum potassium elevated  Hypotension, unspecified hypotension type     Rx / DC Orders   ED Discharge Orders     None        Note:  This document was prepared using Dragon voice recognition software and may include unintentional dictation errors.   Arnaldo NatalMalinda, Cynthya Yam F, MD 12/23/21 [phone]

## 2021-12-23 NOTE — Consult Note (Signed)
Pharmacy Antibiotic Note  Sharon Boyd is a 66 y.o. female admitted on 12/23/2021 with sepsis (unk source).  Pharmacy has been consulted for Vancomycin & Cefepime dosing.  Plan: NOTE: Significant AKI ISO dehydration 2/2 diarrhea x2 weeks PTA:  K: 5.8>__; BUN 95>>__; Scr 5.13 (last BL was 1.16 in 2020).  Cefepime 2g x1 given over ; followed by Cefepime 2g IV q24h (ext-inf) CTM as patient is significantly off last know baseline Scr. Vancomycin 2.5g (1g+1.5g) loading dose x1. Holding off on maintenance dose, Given BMI >45 and significant AKI from dehydration. Current interval calculating to q36-48hr dosing interval. Assess with AM labs to see if dosing by level vs scheduled doses is more appropriate. F/u MRSA PCR ordered & cultures. Also on Metronidazole 500mg  IV q12h.   Height: 5\' 9"  (175.3 cm) Weight: (!) 142 kg (313 lb 0.9 oz) IBW/kg (Calculated) : 66.2  Temp (24hrs), Avg:97.5 F (36.4 C), Min:97.5 F (36.4 C), Max:97.5 F (36.4 C)  Recent Labs  Lab 12/23/21 1044 12/23/21 1124  WBC 4.8   4.7  --   CREATININE 5.13*  --   LATICACIDVEN  --  1.1    Estimated Creatinine Clearance: 17.1 mL/min (A) (by C-G formula based on SCr of 5.13 mg/dL (H)).    No Known Allergies  Antimicrobials this admission: VAN/CFP/MTZ (1/15 >>   Dose adjustments this admission: CTM closely for changes in AKI with hydration.  Cefepime dosed accordingly. Withholding vanc maintenance until AM labs.  Microbiology results: 1/15 Bcx (x4): sent 1/15 UCx: sent  1/15 MRSA PCR: sent 1/15 Cov/Flu: sent  Thank you for allowing pharmacy to be a part of this patients care.  2/15 Sharon Boyd 12/23/2021 2:30 PM

## 2021-12-23 NOTE — ED Notes (Signed)
MD at bedside. Pt BP reading 56/18 on left arm. Pt BP retaken in right forearm reading 69/53.

## 2021-12-23 NOTE — ED Notes (Addendum)
RN and EDT transporting pt to CT and then ICU. Pt's earrings and glasses removed in CT for scan and placed in cup and returned to pt after scan.

## 2021-12-23 NOTE — Progress Notes (Signed)
Elink following for sepsis protocol. 

## 2021-12-23 NOTE — ED Notes (Addendum)
NP and EDP at bedside. Pt BP increased to 200s systolic on initiated dose of levophed. Levophed stopped. BP cuff changed. Will continue to monitor BP Q59min.

## 2021-12-23 NOTE — ED Notes (Signed)
Pt RA sats 88%. Pt placed on 2L [place]. MD aware

## 2021-12-23 NOTE — ED Triage Notes (Signed)
Pt reports goes to the pain clinic and she has to take a UDS before getting meds every week but was not able to provide urine so they did blood work. Pt reports her creatinine and potassium levels were elevated so they told her to come to the ED

## 2021-12-23 NOTE — H&P (Addendum)
NAME:  Sharon Boyd, MRN:  [id number], DOB:  [birth year], LOS: 0 ADMISSION DATE:  12/23/2021, CONSULTATION DATE: 12/23/2021 REFERRING MD: Conni Slipper, MD, CHIEF COMPLAINT: Generalized weakness  HPI  66 y.o female with significant PMH of fibromyalgia, GERD, HTN, HLD, neuropathy, thyroid disease, CKD stage 3, OSA, chronic back pain, chronic neck pain, chronic pain syndrome, anxiety and depression who presented to the ED from pain clinic for evaluation of abnormal lab values.  Patient is a poor historian, history mostly obtained obtained from patient's chart and patient's sister who is currently at the bedside. Per sister, patient called her on Monday stating that she has not been able to urinate and was not feeling well following a fall from the stairs.  Patient reports that she did hit her forehead but did not lose consciousness.  Patient's sister also states she has been sleeping all week with notable slurred speech, complaints of dizziness, poor p.o. intake, dry cough and generalized malaise.  Today her pain clinic called her to report abnormal BUN and creatinine and advised her to go to the ED for further evaluation.  ED Course: In the ED, the temperature was 36.4C, the heart rate 69 beats/minute, the blood pressure 87/60 mm Hg, the respiratory rate 16 breaths/minute, and the oxygen saturation 83% on RA.chest x-ray showed diffuse patchy airspace consolidation throughout both lungs, left greater than right. Pertinent Labs in Red/Diagnostics Findings: Na+/ K+: 140/5.8 Glucose: 96 BUN/Cr.: 95/5.13   WBC: 4.8 Hgb/Hct: 9.9/31.3 PCT: pending Lactic acid: 1.1 COVID PCR: Negative   BNP: pending VBG:  pO2 53; pCO2 46; pH 7.20;  HCO3 18, %O2 Sat 78.1.  Patient given 30 cc/kg of fluids and started on broad-spectrum antibiotics Vanco cefepime and Flagyl for  suspected sepsis with septic shock. Patient remained hypotensive despite IVF boluses with bp readings  56/18 (31)  therefore was started on  Levophed. PCCM consulted.  Past Medical History  Fibromyalgia, GERD, HTN, HLD, neuropathy, thyroid disease, CKD stage 3, OSA, chronic back pain, chronic neck pain, chronic pain syndrome, anxiety and depression  Significant Hospital Events   1/15> Admitted to ICU with hypotension  Consults:  PCCM  Procedures:  None  Significant Diagnostic Tests:  1/15: Chest Xray>Diffuse patchy airspace consolidation throughout both lungs, left greater than right. These findings may represent mixed pattern pulmonary edema or atypical pneumonia. 1/15: Noncontrast CTH/Cervical>No acute intracranial abnormality 1/15: CTA abdomen and pelvis> 1/15: CTA Chest>  Micro Data:  1/15: SARS-CoV-2 PCR> negative 1/15: Influenza PCR> negative 1/15: Blood culture x2> 1/15: Urine Culture> 1/15: MRSA PCR>>  1/15: Strep pneumo urinary antigen> 1/15: Legionella urinary antigen>  Antimicrobials:  Vancomycin 1/15> Cefepime 1/15> Metronidazole 1/15>  OBJECTIVE  Blood pressure (!) 71/34, pulse 72, temperature (!) 97.5 F (36.4 C), temperature source Oral, resp. rate 13, height 5\' 9"  (1.753 m), weight (!) 142 kg, SpO2 97 %.       No intake or output data in the 24 hours ending 12/23/21 1358 Filed Weights   12/23/21 1036  Weight: (!) 142 kg   Physical Examination  GENERAL: 66 year-old critically ill patient lying in the bed with no acute distress.  EYES: Pupils equal, round, reactive to light and accommodation. No scleral icterus. Extraocular muscles intact.  HEENT: Head atraumatic, normocephalic. Oropharynx and nasopharynx clear.  NECK:  Supple, no jugular venous distention. No thyroid enlargement, no tenderness.  LUNGS: Normal breath sounds bilaterally, no wheezing, rales,rhonchi or crepitation. No use of accessory muscles of respiration.  CARDIOVASCULAR: S1, S2 normal. No murmurs,  rubs, or gallops.  ABDOMEN: Soft, nontender, nondistended. Bowel sounds present. No organomegaly or mass.  EXTREMITIES: No  pedal edema, cyanosis, or clubbing.  NEUROLOGIC: Cranial nerves II through XII are intact.  Muscle strength 5/5 in all extremities. Sensation intact. Gait not checked.  PSYCHIATRIC: The patient is alert and oriented x 3.  SKIN: No obvious rash, lesion, or ulcer. vitiligo  Labs/imaging that I havepersonally reviewed  (right click and "Reselect all SmartList Selections" daily)     Labs   CBC: Recent Labs  Lab 12/23/21 1044  WBC 4.8   4.7  NEUTROABS 2.8  HGB 9.9*   9.9*  HCT 31.3*   31.4*  MCV 87.2   87.0  PLT 196   [id number]    Basic Metabolic Panel: Recent Labs  Lab 12/23/21 1044  NA 140  K 5.8*  CL 114*  CO2 18*  GLUCOSE 96  BUN 95*  CREATININE 5.13*  CALCIUM 8.8*   GFR: Estimated Creatinine Clearance: 17.1 mL/min (A) (by C-G formula based on SCr of 5.13 mg/dL (H)). Recent Labs  Lab 12/23/21 1044 12/23/21 1124  WBC 4.8   4.7  --   LATICACIDVEN  --  1.1    Liver Function Tests: Recent Labs  Lab 12/23/21 1044  AST 14*  ALT 11  ALKPHOS 75  BILITOT 0.4  PROT 6.8  ALBUMIN 3.8   Recent Labs  Lab 12/23/21 1044  LIPASE 34   No results for input(s): AMMONIA in the last 168 hours.  ABG    Component Value Date/Time   HCO3 18.0 (L) 12/23/2021 1142   ACIDBASEDEF 9.6 (H) 12/23/2021 1142   O2SAT 78.1 12/23/2021 1142     Coagulation Profile: No results for input(s): INR, PROTIME in the last 168 hours.  Cardiac Enzymes: No results for input(s): CKTOTAL, CKMB, CKMBINDEX, TROPONINI in the last 168 hours.  HbA1C: No results found for: HGBA1C  CBG: No results for input(s): GLUCAP in the last 168 hours.  Review of Systems:   UNABLE TO OBTAIN, PATIENT IS A POOR HISTORIAN  Past Medical History  She,  has a past medical history of Arthritis, Fibromyalgia, and Hypertension.   Surgical History    Past Surgical History:  Procedure Laterality Date   ABDOMINAL HYSTERECTOMY     CHOLECYSTECTOMY       Social History   reports that she has never smoked. She  has never used smokeless tobacco. She reports that she does not drink alcohol and does not use drugs.   Family History   Her family history includes Breast cancer in her mother.   Allergies No Known Allergies   Home Medications  Prior to Admission medications   Not on File  Scheduled Meds:  sodium bicarbonate 150 mEq in D5W infusion   Intravenous Once   Continuous Infusions:  sodium chloride     lactated ringers     metronidazole     norepinephrine (LEVOPHED) Adult infusion     PRN Meds:.docusate sodium, polyethylene glycol  Active Hospital Problem list     Assessment & Plan:  Hypotension  Suspect Sepsis from Pneumonia vs Polypharmacy related hypotension -Supplemental oxygen as needed, to maintain SpO2 > 90% -F/u cultures, trend lactic/ PCT -Monitor WBC/ fever curve -Continue broad spectrum IV antibiotics pending cultures -IVF hydration as needed -Hold sedating medications -Continue Pressors for MAP goal >65 -Strict I/O's  AKI likely prerenal in the setting of hypotension Hx: CKD Stage III Hyperkalemia -Monitor I&O's / urinary output -Follow BMP -Ensure adequate renal perfusion -  Bicarb gtt -Avoid nephrotoxic agents as able -Replace electrolytes as indicated    Acute Metabolic Encephalopathy  Polypharmacy? -Provide supportive care -Obtain UDS, Ammonia Levels -CTH -Hold sedating meds for now  Thyroid Disorder -Check TSH, Free T4   Chronic Pain Syndrome PMHx: Fibromyalgia, neck back and neck pain -Patient follows with pain clinic and [facility] neurosurgery -On multiple pain medication -Hold Tizanidine Lyrica and oxycodone for now   Anxiety and depression -Hold Xanax, trazodone , cymbalta  Best practice:  Diet:  NPO Pain/Anxiety/Delirium protocol (if indicated): No VAP protocol (if indicated): Not indicated DVT prophylaxis: Subcutaneous Heparin GI prophylaxis: PPI Glucose control:  SSI No Central venous access:  N/A Arterial line:  N/A Foley:  Yes,  and it is still needed Mobility:  bed rest  PT consulted: N/A Last date of multidisciplinary goals of care discussion [1/15] Code Status:  full code Disposition: ICU   = Goals of Care = Code Status Order: FULL  Primary Emergency Contact: Jones,Sherri, Home Phone: [phone] Wishes to pursue full aggressive treatment and intervention options, including CPR and intubation, but goals of care will be addressed on going with family if that should become necessary.   Critical care time: 45 minutes     Rufina Falco, DNP, CCRN, FNP-C, AGACNP-BC Acute Care Nurse Practitioner  [facility] Pager: [phone] [place] [facility]  .

## 2021-12-23 NOTE — Progress Notes (Signed)
An USGPIV (ultrasound guided PIV) has been placed for short-term vasopressor infusion. A correctly placed ivWatch must be used when administering Vasopressors. Should this treatment be needed beyond 72 hours, central line access should be obtained.  It will be the responsibility of the bedside nurse to follow best practice to prevent extravasations.  A purple skin marker was used to show where the tip of the catheter ends.  Pt educated also and will let the RN know if the iv site hurts or changes in anyway.

## 2021-12-23 NOTE — ED Notes (Signed)
CT called to have room open

## 2021-12-23 NOTE — Progress Notes (Signed)
PHARMACY CONSULT NOTE - FOLLOW UP  Pharmacy Consult for Electrolyte Monitoring and Replacement   Recent Labs: Potassium (mmol/L)  Date Value  12/23/2021 5.8 (H)  12/16/2013 3.9   Magnesium (mg/dL)  Date Value  12/15/2013 2.0   Calcium (mg/dL)  Date Value  12/23/2021 8.8 (L)   Calcium, Total (mg/dL)  Date Value  12/16/2013 8.3 (L)   Albumin (g/dL)  Date Value  12/23/2021 3.8  12/12/2013 3.3 (L)   Sodium (mmol/L)  Date Value  12/23/2021 140  12/16/2013 141   Assessment: 66 y.o. female w/ PMH of HTN, fibromyalgia admitted 12/23/21 for  creatinine and potassium that are markedly elevated. Pharmacy is asked to follow and replace electrolytes while the patient is in the CCU.  MIVF: sodium bicarbonate 150 mEq in dextrose 5 % at 150 mL/hr   Goal of Therapy:  Electrolytes WNL  Plan:  No electrolyte replacement warranted for today Recheck electrolytes in am  Dallie Piles ,PharmD Clinical Pharmacist 12/23/2021 1:40 PM

## 2021-12-24 ENCOUNTER — Inpatient Hospital Stay: Payer: Medicare Other

## 2021-12-24 ENCOUNTER — Inpatient Hospital Stay
Admit: 2021-12-24 | Discharge: 2021-12-24 | Disposition: A | Discharge status: Home / Self Care | Payer: Medicare Other | Attending: Pulmonary Disease | Admitting: Pulmonary Disease

## 2021-12-24 DIAGNOSIS — I959 Hypotension, unspecified: Secondary | ICD-10-CM

## 2021-12-24 DIAGNOSIS — A419 Sepsis, unspecified organism: Secondary | ICD-10-CM

## 2021-12-24 LAB — URINE DRUG SCREEN, QUALITATIVE (ARMC ONLY)
Amphetamines, Ur Screen: NOT DETECTED
Barbiturates, Ur Screen: NOT DETECTED
Benzodiazepine, Ur Scrn: NOT DETECTED
Cannabinoid 50 Ng, Ur ~~LOC~~: NOT DETECTED
Cocaine Metabolite,Ur ~~LOC~~: NOT DETECTED
MDMA (Ecstasy)Ur Screen: NOT DETECTED
Methadone Scn, Ur: NOT DETECTED
Opiate, Ur Screen: NOT DETECTED
Phencyclidine (PCP) Ur S: NOT DETECTED
Tricyclic, Ur Screen: NOT DETECTED

## 2021-12-24 LAB — BLOOD GAS, VENOUS
Acid-base deficit: 8 mmol/L — ABNORMAL HIGH (ref 0.0–2.0)
Bicarbonate: 19.9 mmol/L — ABNORMAL LOW (ref 20.0–28.0)
O2 Saturation: 48 %
Patient temperature: 37
pCO2, Ven: 51 mmHg (ref 44.0–60.0)
pH, Ven: 7.2 — ABNORMAL LOW (ref 7.250–7.430)
pO2, Ven: 33 mmHg (ref 32.0–45.0)

## 2021-12-24 LAB — CBC
HCT: 31.3 % — ABNORMAL LOW (ref 36.0–46.0)
Hemoglobin: 9.9 g/dL — ABNORMAL LOW (ref 12.0–15.0)
MCH: 27.3 pg (ref 26.0–34.0)
MCHC: 31.6 g/dL (ref 30.0–36.0)
MCV: 86.2 fL (ref 80.0–100.0)
Platelets: 140 10*3/uL — ABNORMAL LOW (ref 150–400)
RBC: 3.63 MIL/uL — ABNORMAL LOW (ref 3.87–5.11)
RDW: 14.2 % (ref 11.5–15.5)
WBC: 6.2 10*3/uL (ref 4.0–10.5)
nRBC: 0 % (ref 0.0–0.2)

## 2021-12-24 LAB — MAGNESIUM
Magnesium: 1.6 mg/dL — ABNORMAL LOW (ref 1.7–2.4)
Magnesium: 1.7 mg/dL (ref 1.7–2.4)

## 2021-12-24 LAB — BASIC METABOLIC PANEL
Anion gap: 10 (ref 5–15)
BUN: 76 mg/dL — ABNORMAL HIGH (ref 8–23)
CO2: 17 mmol/L — ABNORMAL LOW (ref 22–32)
Calcium: 8.5 mg/dL — ABNORMAL LOW (ref 8.9–10.3)
Chloride: 117 mmol/L — ABNORMAL HIGH (ref 98–111)
Creatinine, Ser: 3.41 mg/dL — ABNORMAL HIGH (ref 0.44–1.00)
GFR, Estimated: 15 mL/min — ABNORMAL LOW (ref >60)
Glucose, Bld: 88 mg/dL (ref 70–99)
Potassium: 5.1 mmol/L (ref 3.5–5.1)
Sodium: 144 mmol/L (ref 135–145)

## 2021-12-24 LAB — HEPATITIS B CORE ANTIBODY, TOTAL: Hep B Core Total Ab: NONREACTIVE

## 2021-12-24 LAB — HEPATITIS B SURFACE ANTIGEN: Hepatitis B Surface Ag: NONREACTIVE

## 2021-12-24 LAB — HEPATITIS C ANTIBODY: HCV Ab: NONREACTIVE

## 2021-12-24 LAB — PHOSPHORUS
Phosphorus: 5.5 mg/dL — ABNORMAL HIGH (ref 2.5–4.6)
Phosphorus: 5.6 mg/dL — ABNORMAL HIGH (ref 2.5–4.6)

## 2021-12-24 LAB — PROCALCITONIN: Procalcitonin: <0.1 ng/mL

## 2021-12-24 LAB — POTASSIUM: Potassium: 5.1 mmol/L (ref 3.5–5.1)

## 2021-12-24 LAB — STREP PNEUMONIAE URINARY ANTIGEN: Strep Pneumo Urinary Antigen: NEGATIVE

## 2021-12-24 LAB — HEPATITIS B SURFACE ANTIBODY,QUALITATIVE: Hep B S Ab: NONREACTIVE

## 2021-12-24 LAB — HEPATITIS B CORE ANTIBODY, IGM: Hep B C IgM: NONREACTIVE

## 2021-12-24 MED ORDER — CHLORHEXIDINE GLUCONATE CLOTH 2 % EX PADS
6.0000 | MEDICATED_PAD | Freq: Every day | CUTANEOUS | Status: DC
  Administered 2021-12-24 – 2021-12-27 (×3): 6 via TOPICAL

## 2021-12-24 MED ORDER — MIDODRINE HCL 5 MG PO TABS
5.0000 mg | ORAL_TABLET | Freq: Three times a day (TID) | ORAL | Status: DC
  Administered 2021-12-24 – 2021-12-25 (×3): 5 mg via ORAL
  Filled 2021-12-24 (×3): qty 1

## 2021-12-24 MED ORDER — SODIUM BICARBONATE 8.4 % IV SOLN
Freq: Once | INTRAVENOUS | Status: AC
  Filled 2021-12-24: qty 150

## 2021-12-24 MED ORDER — ACETAMINOPHEN 325 MG PO TABS
650.0000 mg | ORAL_TABLET | Freq: Four times a day (QID) | ORAL | Status: DC | PRN
  Administered 2021-12-24 – 2021-12-27 (×6): 650 mg via ORAL
  Filled 2021-12-24 (×6): qty 2

## 2021-12-24 MED ORDER — SODIUM BICARBONATE 8.4 % IV SOLN
50.0000 meq | Freq: Once | INTRAVENOUS | Status: AC
  Administered 2021-12-24: 50 meq via INTRAVENOUS
  Filled 2021-12-24: qty 50

## 2021-12-24 MED ORDER — ONDANSETRON HCL 4 MG/2ML IJ SOLN
4.0000 mg | Freq: Four times a day (QID) | INTRAMUSCULAR | Status: DC | PRN
  Administered 2021-12-24 – 2021-12-25 (×2): 4 mg via INTRAVENOUS
  Filled 2021-12-24 (×2): qty 2

## 2021-12-24 MED ORDER — SODIUM BICARBONATE 8.4 % IV SOLN
INTRAVENOUS | Status: DC
  Filled 2021-12-24: qty 150
  Filled 2021-12-24: qty 1000
  Filled 2021-12-24: qty 150
  Filled 2021-12-24: qty 1000
  Filled 2021-12-24: qty 150

## 2021-12-24 MED ORDER — VANCOMYCIN VARIABLE DOSE PER UNSTABLE RENAL FUNCTION (PHARMACIST DOSING): Status: DC

## 2021-12-24 MED ORDER — MAGNESIUM SULFATE 2 GM/50ML IV SOLN
2.0000 g | Freq: Once | INTRAVENOUS | Status: AC
  Administered 2021-12-24: 2 g via INTRAVENOUS
  Filled 2021-12-24: qty 50

## 2021-12-24 MED ORDER — HEPARIN SODIUM (PORCINE) 5000 UNIT/ML IJ SOLN
5000.0000 [IU] | Freq: Three times a day (TID) | INTRAMUSCULAR | Status: DC
  Administered 2021-12-24 – 2021-12-27 (×8): 5000 [IU] via SUBCUTANEOUS
  Filled 2021-12-24 (×8): qty 1

## 2021-12-24 NOTE — Progress Notes (Signed)
PHARMACY CONSULT NOTE  Pharmacy Consult for Electrolyte Monitoring and Replacement   Recent Labs: Potassium (mmol/L)  Date Value  12/24/2021 5.1  12/16/2013 3.9   Magnesium (mg/dL)  Date Value  16/38/4536 1.6 (L)  12/15/2013 2.0   Calcium (mg/dL)  Date Value  46/80/3212 8.5 (L)   Calcium, Total (mg/dL)  Date Value  24/82/5003 8.3 (L)   Albumin (g/dL)  Date Value  70/48/8891 3.2 (L)  12/12/2013 3.3 (L)   Phosphorus (mg/dL)  Date Value  69/45/0388 5.5 (H)   Sodium (mmol/L)  Date Value  12/24/2021 144  12/16/2013 141   Assessment: 66 y.o. female w/ PMH of HTN, fibromyalgia admitted 12/23/21 for  creatinine and potassium that are markedly elevated. Pharmacy is asked to follow and replace electrolytes while the patient is in the CCU.  MIVF: sodium bicarbonate 150 mEq in dextrose 5 % at 125 mL/hr   Goal of Therapy:  Electrolytes within normal limits  Plan:  Hyperkalemia resolved with Lokelma yesterday. Potassium remains at ULN Mg 1.6, IV magnesium sulfate 2 g x 1 Follow-up electrolytes with AM labs tomorrow  Tressie Ellis 12/24/2021 8:18 AM

## 2021-12-24 NOTE — Progress Notes (Signed)
Mental status has improved overnight, she is now very clear verbally and does not have to take extra time to verbalize thoughts. She did speak on her home situation, she lives with her son, his wife, his wife's parents, and her grandson. She has family and financial stressors, spoke with NP regarding family situation, case management has been consulted.

## 2021-12-24 NOTE — Progress Notes (Signed)
NAME:  Sharon Boyd, MRN:  [id number], DOB:  [birth year], LOS: 1 ADMISSION DATE:  12/23/2021, CONSULTATION DATE: 12/23/2021 REFERRING MD: Conni Slipper, MD, CHIEF COMPLAINT: Generalized weakness  HPI  66 y.o female with significant PMH of fibromyalgia, GERD, HTN, HLD, neuropathy, thyroid disease, CKD stage 3, OSA, chronic back pain, chronic neck pain, chronic pain syndrome, anxiety and depression who presented to the ED from pain clinic for evaluation of abnormal lab values.  Patient is a poor historian, history mostly obtained obtained from patient's chart and patient's sister who is currently at the bedside. Per sister, patient called her on Monday stating that she has not been able to urinate and was not feeling well following a fall from the stairs.  Patient reports that she did hit her forehead but did not lose consciousness.  Patient's sister also states she has been sleeping all week with notable slurred speech, complaints of dizziness, poor p.o. intake, dry cough and generalized malaise.  Today her pain clinic called her to report abnormal BUN and creatinine and advised her to go to the ED for further evaluation.  ED Course: In the ED, the temperature was 36.4C, the heart rate 69 beats/minute, the blood pressure 87/60 mm Hg, the respiratory rate 16 breaths/minute, and the oxygen saturation 83% on RA.chest x-ray showed diffuse patchy airspace consolidation throughout both lungs, left greater than right. Pertinent Labs in Red/Diagnostics Findings: Na+/ K+: 140/5.8 Glucose: 96 BUN/Cr.: 95/5.13   WBC: 4.8 Hgb/Hct: 9.9/31.3 PCT: pending Lactic acid: 1.1 COVID PCR: Negative   BNP: pending VBG:  pO2 53; pCO2 46; pH 7.20;  HCO3 18, %O2 Sat 78.1.  Patient given 30 cc/kg of fluids and started on broad-spectrum antibiotics Vanco cefepime and Flagyl for  suspected sepsis with septic shock. Patient remained hypotensive despite IVF boluses with bp readings  56/18 (31)  therefore was started on  Levophed. PCCM consulted.  Past Medical History  Fibromyalgia, GERD, HTN, HLD, neuropathy, thyroid disease, CKD stage 3, OSA, chronic back pain, chronic neck pain, chronic pain syndrome, anxiety and depression  Significant Hospital Events   1/15> Admitted to ICU with hypotension 1/16> Weaning Levophed, Nephrology consulted.  WBC & PCT normal, doubt sepsis, will d/c ABX for now.  Obtain ECHO  Consults:  PCCM Nephrology  Procedures:  None  Significant Diagnostic Tests:  1/15: Chest Xray>Diffuse patchy airspace consolidation throughout both lungs, left greater than right. These findings may represent mixed pattern pulmonary edema or atypical pneumonia. 1/15: Noncontrast CTH/Cervical>No acute intracranial abnormality 1/15: CTA abdomen and pelvis>Exophytic lesion from the left kidney posteriorly which may represent a small cyst but incompletely evaluated on this exam. Ultrasound of the kidneys may be helpful as an initial evaluation technique when the patient's clinical symptomatology improves. No other acute abnormality is identified. 1/15: CTA Chest>Mild interstitial edema. No focal infiltrate is seen.  Micro Data:  1/15: SARS-CoV-2 PCR> negative 1/15: Influenza PCR> negative 1/15: Blood culture x2> 1/15: Urine Culture> 1/15: MRSA PCR>> positive 1/15: Strep pneumo urinary antigen> 1/15: Legionella urinary antigen>  Antimicrobials:  Vancomycin 1/15>1/16 Cefepime 1/15>1/16 Metronidazole 1/15>1/16  OBJECTIVE  Blood pressure 109/68, pulse 100, temperature 98 F (36.7 C), temperature source Oral, resp. rate (!) 23, height 5\' 9"  (1.753 m), weight (!) 138.8 kg, SpO2 100 %.        Intake/Output Summary (Last 24 hours) at 12/24/2021 0842 Last data filed at 12/24/2021 0621 Gross per 24 hour  Intake 854.92 ml  Output 3800 ml  Net -2945.08 ml   [employer]  12/23/21 1036 12/23/21 1437  Weight: (!) 142 kg (!) 138.8 kg   Physical Examination  GENERAL: 66 year-old  critically ill patient lying in the bed with no acute distress.  EYES: Pupils equal, round, reactive to light and accommodation. No scleral icterus. Extraocular muscles intact.  HEENT: Head atraumatic, normocephalic. Oropharynx and nasopharynx clear.  NECK:  Supple, no jugular venous distention. No thyroid enlargement, no tenderness.  LUNGS: Normal breath sounds bilaterally, no wheezing, rales,rhonchi or crepitation. No use of accessory muscles of respiration.  CARDIOVASCULAR: S1, S2 normal. No murmurs, rubs, or gallops.  ABDOMEN: Soft, nontender, nondistended. Bowel sounds present. No organomegaly or mass.  EXTREMITIES: No pedal edema, cyanosis, or clubbing.  NEUROLOGIC: Cranial nerves II through XII are intact.  Muscle strength 5/5 in all extremities. Sensation intact. Gait not checked.  PSYCHIATRIC: The patient is alert and oriented x 3.  SKIN: No obvious rash, lesion, or ulcer. vitiligo  Labs/imaging that I havepersonally reviewed  (right click and "Reselect all SmartList Selections" daily)     Labs   CBC: Recent Labs  Lab 12/23/21 1044 12/24/21 0422  WBC 4.8   4.7 6.2  NEUTROABS 2.8  --   HGB 9.9*   9.9* 9.9*  HCT 31.3*   31.4* 31.3*  MCV 87.2   87.0 86.2  PLT 196   198 140*     Basic Metabolic Panel: Recent Labs  Lab 12/23/21 1044 12/23/21 1452 12/23/21 2352 12/24/21 0422  NA 140 142  --  144  K 5.8* 6.0* 5.1 5.1  CL 114* 120*  --  117*  CO2 18* 17*  --  17*  GLUCOSE 96 105*  --  88  BUN 95* 84*  --  76*  CREATININE 5.13* 4.55*  --  3.41*  CALCIUM 8.8* 7.9*  --  8.5*  MG  --   --  1.7 1.6*  PHOS  --   --  5.6* 5.5*    GFR: Estimated Creatinine Clearance: 25.4 mL/min (A) (by C-G formula based on SCr of 3.41 mg/dL (H)). Recent Labs  Lab 12/23/21 1044 12/23/21 1124 12/23/21 1452 12/24/21 0422  PROCALCITON  --   --  <0.10  --   WBC 4.8   4.7  --   --  6.2  LATICACIDVEN  --  1.1 1.4  --      Liver Function Tests: Recent Labs  Lab 12/23/21 1044  12/23/21 1452  AST 14* 13*  ALT 11 10  ALKPHOS 75 63  BILITOT 0.4 0.8  PROT 6.8 5.5*  ALBUMIN 3.8 3.2*    Recent Labs  Lab 12/23/21 1044  LIPASE 34    Recent Labs  Lab 12/23/21 1452  AMMONIA 30    ABG    Component Value Date/Time   HCO3 19.9 (L) 12/24/2021 0422   ACIDBASEDEF 8.0 (H) 12/24/2021 0422   O2SAT 48.0 12/24/2021 0422      Coagulation Profile: Recent Labs  Lab 12/23/21 1452  INR 1.2    Cardiac Enzymes: No results for input(s): CKTOTAL, CKMB, CKMBINDEX, TROPONINI in the last 168 hours.  HbA1C: No results found for: HGBA1C  CBG: Recent Labs  Lab 12/23/21 1429  GLUCAP 80    Review of Systems:   Positives in BOLD: Gen: Denies fever, chills, weight change, fatigue, night sweats HEENT: Denies blurred vision, double vision, hearing loss, tinnitus, sinus congestion, rhinorrhea, sore throat, neck stiffness, dysphagia PULM: Denies shortness of breath, cough, sputum production, hemoptysis, wheezing CV: Denies chest pain, edema, orthopnea, paroxysmal nocturnal dyspnea,  palpitations GI: Denies abdominal pain, nausea, vomiting, diarrhea, hematochezia, melena, constipation, change in bowel habits GU: Denies dysuria, hematuria, polyuria, oliguria, urethral discharge Endocrine: Denies hot or cold intolerance, polyuria, polyphagia or appetite change Derm: Denies rash, dry skin, scaling or peeling skin change Heme: Denies easy bruising, bleeding, bleeding gums Neuro: Denies headache, numbness, weakness, slurred speech, loss of memory or consciousness   Past Medical History  She,  has a past medical history of Arthritis, Fibromyalgia, and Hypertension.   Surgical History    Past Surgical History:  Procedure Laterality Date   ABDOMINAL HYSTERECTOMY     CHOLECYSTECTOMY       Social History   reports that she has never smoked. She has never used smokeless tobacco. She reports that she does not drink alcohol and does not use drugs.   Family History    Her family history includes Breast cancer in her mother.   Allergies No Known Allergies   Home Medications  Prior to Admission medications   Not on File  Scheduled Meds:  Chlorhexidine Gluconate Cloth  6 each Topical Daily   influenza vac split quadrivalent PF  0.5 mL Intramuscular Tomorrow-1000   mouth rinse  15 mL Mouth Rinse BID   vancomycin variable dose per unstable renal function (pharmacist dosing)   Does not apply See admin instructions   Continuous Infusions:  sodium chloride 10 mL/hr at 12/23/21 1800   ceFEPime (MAXIPIME) IV     metronidazole 500 mg (12/24/21 0249)   norepinephrine (LEVOPHED) Adult infusion Stopped (12/24/21 0235)   sodium bicarbonate 150 mEq in D5W infusion 125 mL/hr at 12/24/21 0621   PRN Meds:.docusate sodium, ondansetron (ZOFRAN) IV, polyethylene glycol  Active Hospital Problem list     Assessment & Plan:   Multifactorial Shock: Suspect Hypovolemia (in setting of several days of diarrhea) +/- Polypharmacy related hypotension (septic shock seems less likely, NO Leukocytosis) -Continuous cardiac monitoring -Maintain MAP >65 -IV fluids (received 3L NS bolus in ED) -Vasopressors as needed to maintain MAP goal  -Start Midodrine 5 mg TID -Lactic acid normalized -HS Troponin negative x2 ( 3 ~ 3) -Random cortisol 5.4 -Obtain Echocardiogram  -CTA Chest consistent with Mild interstitial edema. No focal infiltrate is seen.  AKI, likely prerenal in the setting of hypotension Non-Anion Gap Metabolic Acidosis (likely due to diarrhea +multiple boluses of NS) Hx: CKD Stage III Hyperkalemia ~ IMPROVING -Monitor I&O's / urinary output -Follow BMP -Ensure adequate renal perfusion -Avoid nephrotoxic agents as able -Replace electrolytes as indicated -Pharmacy following for assistance with electrolyte replacement -Continue Bicarb gtt -Renal US pending -Hold home Spironolactone, lisinopril, and HCTZ -Nephrology consulted, appreciate input    Acute  Metabolic Encephalopathy  Polypharmacy? -Provide supportive care -Avoid sedating meds as able -Ammonia normal at 30 -UDS pending -CTH negative for acute intracranial abnormality  Thyroid Disorder -Normal TSH 0.716, Free T4 0.95   Chronic Pain Syndrome PMHx: Fibromyalgia, neck back and neck pain -Patient follows with pain clinic and [facility] neurosurgery -On multiple pain medications -Hold Tizanidine Lyrica and oxycodone for now   Anxiety and depression -Hold Xanax, trazodone , cymbalta  Best practice:  Diet:  Heart healthy/carb modified as tolerated Pain/Anxiety/Delirium protocol (if indicated): No VAP protocol (if indicated): Not indicated DVT prophylaxis: Subcutaneous Heparin GI prophylaxis: PPI Glucose control:  SSI No Central venous access:  N/A Arterial line:  N/A Foley:  Yes, and it is still needed Mobility:  bed rest  PT consulted: N/A Last date of multidisciplinary goals of care discussion [1/16] Code Status:  full code Disposition: ICU   = Goals of Care = Code Status Order: FULL  Primary Emergency Contact: Jones,Sherri, Home Phone: [phone] Wishes to pursue full aggressive treatment and intervention options, including CPR and intubation, but goals of care will be addressed on going with family if that should become necessary.   Critical care time: 40 minutes    Darel Hong, AGACNP-BC [facility] Prefer epic messenger for cross cover needs If after hours, please call E-link   .

## 2021-12-24 NOTE — Progress Notes (Addendum)
PATIENT REMAINS SLEEPY BUT AROUSES EASILY. MORE AND MORE ALERT AS THE DAY PROGRESSES. 1400 LEVOPHED TURNED OFF.RESTING IN BED AFTER BATH WATCHING TELEVISION. NO COMPLAINTS OF PAIN ALL DAY. MORE ALERT FOLLOWING AFTERNOON NAP. WEANED OFF OF OXYGEN. VERY PLEASANT.

## 2021-12-24 NOTE — Consult Note (Signed)
[facility]  CONSULT NOTE    Date: 12/24/2021                  Patient Name:  Sharon Boyd  MRN: [id number]  DOB: 23-May-1956  Age / Sex: 66 y.o., female         PCP: Alvester Chou, NP                 Service Requesting Consult: Dr. Merrilee Jansky                 Reason for Consult: Acute kidney injury with metabolic acidosis and hyperkalemia            History of Present Illness: Sharon Boyd presents to [facility] for acute kidney injury with metabolic acidosis and hyperkalemia. Patient has chronic pain and had labs done by her outpatient pain clinic which showed elevated BUN and creatinine. She was then asked to present to the ED. Patient was found to have creatinine of 4.55 with BUN of 105 and potassium 6. Patient with nongap acidosis. She was placed on IV fluids and started on norepinephrine. Creatinine has improved to 3.41 and potassium has improved this morning. However patient continues to have serum CO2 of 17. Nephrology has been consulted.   Patient is having some confusion and unable to give a good history. Patient's sister is at bedside. States patient has been having diarrhea for several days. Unable to quantify.   Seems that patient was on spironolactone, celecoxib, lisinopril, and hydrochlorothiazide at home.    Medications: Outpatient medications: Medications Prior to Admission  Medication Sig Dispense Refill Last Dose   albuterol (VENTOLIN HFA) 108 (90 Base) MCG/ACT inhaler Inhale 2 puffs into the lungs every 6 (six) hours as needed.   Past Week   ALPRAZolam (XANAX) 0.5 MG tablet Take 1 tablet by mouth 2 (two) times daily as needed.   12/22/2021   buprenorphine (BUTRANS) 5 MCG/HR PTWK as directed.   12/20/2021   celecoxib (CELEBREX) 100 MG capsule Take 1 capsule by mouth daily.   12/23/2021   DULoxetine (CYMBALTA) 60 MG capsule Take 60 mg by mouth daily.   12/23/2021   ergocalciferol (VITAMIN D2) 1.25 MG (50000 UT) capsule Take 1 capsule by mouth  in the morning and at bedtime. Wednesday and saturday   12/22/2021   fluticasone (FLONASE) 50 MCG/ACT nasal spray Place 1 spray into both nostrils daily.   Past Week   lisinopril-hydrochlorothiazide (ZESTORETIC) 20-25 MG tablet Take 1 tablet by mouth daily.   12/23/2021   metoprolol tartrate (LOPRESSOR) 100 MG tablet Take 1 tablet by mouth in the morning and at bedtime.   12/23/2021   Multiple Vitamin (MULTI-VITAMIN) tablet Take 1 tablet by mouth daily.   12/23/2021   oxyCODONE-acetaminophen (PERCOCET) 10-325 MG tablet Take 1 tablet by mouth 4 (four) times daily as needed.   12/23/2021   pantoprazole (PROTONIX) 40 MG tablet Take 1 tablet by mouth daily.   12/23/2021   pregabalin (LYRICA) 150 MG capsule Take 150 mg by mouth 2 (two) times daily.   12/23/2021   rizatriptan (MAXALT) 10 MG tablet Take by mouth as directed.   Past Week   simvastatin (ZOCOR) 40 MG tablet Take 1 tablet by mouth daily.   12/22/2021   spironolactone (ALDACTONE) 50 MG tablet Take 50 mg by mouth 2 (two) times daily.   12/23/2021   SYMBICORT 160-4.5 MCG/ACT inhaler Inhale 1 puff into the lungs daily.   12/23/2021   tiZANidine (ZANAFLEX)  4 MG tablet Take 1 tablet by mouth daily.   12/22/2021   topiramate (TOPAMAX) 50 MG tablet Take 1 tablet by mouth in the morning and at bedtime.   12/23/2021   traZODone (DESYREL) 150 MG tablet Take 1 tablet by mouth at bedtime.   12/22/2021   naloxone (NARCAN) nasal spray 4 mg/0.1 mL Place into the nose as directed.   prn at prn    Current medications: Current Facility-Administered Medications  Medication Dose Route Frequency Provider Last Rate Last Admin   0.9 %  sodium chloride infusion  250 mL Intravenous Continuous Lang Snow, NP 10 mL/hr at 12/23/21 1800 Infusion Verify at 12/23/21 1800   acetaminophen (TYLENOL) tablet 650 mg  650 mg Oral Q6H PRN Bradly Bienenstock, NP       ceFEPIme (MAXIPIME) 2 g in sodium chloride 0.9 % 100 mL IVPB  2 g Intravenous Q24H Beers, Brandon D, RPH        Chlorhexidine Gluconate Cloth 2 % PADS 6 each  6 each Topical Daily Kasa, Kurian, MD       docusate sodium (COLACE) capsule 100 mg  100 mg Oral BID PRN Lang Snow, NP       heparin injection 5,000 Units  5,000 Units Subcutaneous Q8H Darel Hong D, NP       influenza vac split quadrivalent PF (FLUARIX) injection 0.5 mL  0.5 mL Intramuscular Tomorrow-1000 Flora Lipps, MD       MEDLINE mouth rinse  15 mL Mouth Rinse BID Flora Lipps, MD   15 mL at 12/23/21 2145   metroNIDAZOLE (FLAGYL) IVPB 500 mg  500 mg Intravenous Q12H Lang Snow, NP 100 mL/hr at 12/24/21 0249 500 mg at 12/24/21 0249   midodrine (PROAMATINE) tablet 5 mg  5 mg Oral TID WC Darel Hong D, NP       norepinephrine (LEVOPHED) 4mg  in 271mL (0.016 mg/mL) premix infusion  2-10 mcg/min Intravenous Titrated Lang Snow, NP   Stopped at 12/24/21 0235   ondansetron (ZOFRAN) injection 4 mg  4 mg Intravenous Q6H PRN Rust-Chester, Huel Cote, NP   4 mg at 12/24/21 [id number]   polyethylene glycol (MIRALAX / GLYCOLAX) packet 17 g  17 g Oral Daily PRN Lang Snow, NP       sodium bicarbonate 150 mEq in dextrose 5 % 1,150 mL infusion   Intravenous Continuous Rust-Chester, Huel Cote, NP 125 mL/hr at 12/24/21 0621 New Bag at 12/24/21 0621   vancomycin variable dose per unstable renal function (pharmacist dosing)   Does not apply See admin instructions Benita Gutter, RPH          Allergies: No Known Allergies    Past Medical History: Past Medical History:  Diagnosis Date   Arthritis    Fibromyalgia    Hypertension      Past Surgical History: Past Surgical History:  Procedure Laterality Date   ABDOMINAL HYSTERECTOMY     CHOLECYSTECTOMY       Family History: Family History  Problem Relation Age of Onset   Breast cancer Mother      Social History: Social History   Socioeconomic History   Marital status: Married    Spouse name: Not on file   Number of children: Not on file    Years of education: Not on file   Highest education level: Not on file  Occupational History   Not on file  Tobacco Use   Smoking status: Never   Smokeless tobacco: Never  Substance and Sexual Activity   Alcohol use: Never   Drug use: Never   Sexual activity: Not on file  Other Topics Concern   Not on file  Social History Narrative   Not on file   Social Determinants of Health   Financial Resource Strain: Not on file  Food Insecurity: Not on file  Transportation Needs: Not on file  Physical Activity: Not on file  Stress: Not on file  Social Connections: Not on file  Intimate Partner Violence: Not on file     Review of Systems: Review of Systems  Constitutional: Negative.   HENT: Negative.    Eyes: Negative.   Respiratory:  Negative for cough, hemoptysis, sputum production, shortness of breath and wheezing.   Cardiovascular:  Negative for chest pain, palpitations, orthopnea, claudication, leg swelling and PND.  Gastrointestinal:  Positive for diarrhea.  Genitourinary:  Negative for dysuria, flank pain, frequency, hematuria and urgency.  Musculoskeletal:  Negative for back pain, falls, joint pain, myalgias and neck pain.  Skin: Negative.   Neurological: Negative.   Endo/Heme/Allergies: Negative.   Psychiatric/Behavioral: Negative.     Vital Signs: Blood pressure 104/63, pulse 86, temperature 97.8 F (36.6 C), resp. rate 19, height 5\' 9"  (1.753 m), weight (!) 138.8 kg, SpO2 100 %.  Weight trends: Filed Weights   12/23/21 1036 12/23/21 1437  Weight: (!) 142 kg (!) 138.8 kg    Physical Exam: General: NAD, laying in bed  Head: Normocephalic, atraumatic. Moist oral mucosal membranes  Eyes: Anicteric, PERRL  Neck: Supple, trachea midline  Lungs:  Clear to auscultation  Heart: Regular rate and rhythm  Abdomen:  Soft, nontender,   Extremities:  no peripheral edema.  Neurologic: Nonfocal, moving all four extremities  Skin: No lesions  Access: none     Lab  results: Basic Metabolic Panel: Recent Labs  Lab 12/23/21 1044 12/23/21 1452 12/23/21 2352 12/24/21 0422  NA 140 142  --  144  K 5.8* 6.0* 5.1 5.1  CL 114* 120*  --  117*  CO2 18* 17*  --  17*  GLUCOSE 96 105*  --  88  BUN 95* 84*  --  76*  CREATININE 5.13* 4.55*  --  3.41*  CALCIUM 8.8* 7.9*  --  8.5*  MG  --   --  1.7 1.6*  PHOS  --   --  5.6* 5.5*    Liver Function Tests: Recent Labs  Lab 12/23/21 1044 12/23/21 1452  AST 14* 13*  ALT 11 10  ALKPHOS 75 63  BILITOT 0.4 0.8  PROT 6.8 5.5*  ALBUMIN 3.8 3.2*   Recent Labs  Lab 12/23/21 1044  LIPASE 34   Recent Labs  Lab 12/23/21 1452  AMMONIA 30    CBC: Recent Labs  Lab 12/23/21 1044 12/24/21 0422  WBC 4.8   4.7 6.2  NEUTROABS 2.8  --   HGB 9.9*   9.9* 9.9*  HCT 31.3*   31.4* 31.3*  MCV 87.2   87.0 86.2  PLT 196   198 140*    Cardiac Enzymes: No results for input(s): CKTOTAL, CKMB, CKMBINDEX, TROPONINI in the last 168 hours.  BNP: Invalid input(s): POCBNP  CBG: Recent Labs  Lab 12/23/21 1429  GLUCAP 80    Microbiology: Results for orders placed or performed during the hospital encounter of 12/23/21  Resp Panel by RT-PCR (Flu A&B, Covid) Nasopharyngeal Swab     Status: None   Collection Time: 12/23/21  1:51 PM   Specimen: Nasopharyngeal Swab; Nasopharyngeal(NP)  swabs in vial transport medium  Result Value Ref Range Status   SARS Coronavirus 2 by RT PCR NEGATIVE NEGATIVE Final    Comment: (NOTE) SARS-CoV-2 target nucleic acids are NOT DETECTED.  The SARS-CoV-2 RNA is generally detectable in upper respiratory specimens during the acute phase of infection. The lowest concentration of SARS-CoV-2 viral copies this assay can detect is 138 copies/mL. A negative result does not preclude SARS-Cov-2 infection and should not be used as the sole basis for treatment or other patient management decisions. A negative result may occur with  improper specimen collection/handling, submission of specimen  other than nasopharyngeal swab, presence of viral mutation(s) within the areas targeted by this assay, and inadequate number of viral copies(<138 copies/mL). A negative result must be combined with clinical observations, patient history, and epidemiological information. The expected result is Negative.  Fact Sheet for Patients:  EntrepreneurPulse.com.au  Fact Sheet for Healthcare Providers:  IncredibleEmployment.be  This test is no t yet approved or cleared by the [place] FDA and  has been authorized for detection and/or diagnosis of SARS-CoV-2 by FDA under an Emergency Use Authorization (EUA). This EUA will remain  in effect (meaning this test can be used) for the duration of the COVID-19 declaration under Section 564(b)(1) of the Act, 21 U.S.C.section 360bbb-3(b)(1), unless the authorization is terminated  or revoked sooner.       Influenza A by PCR NEGATIVE NEGATIVE Final   Influenza B by PCR NEGATIVE NEGATIVE Final    Comment: (NOTE) The Xpert Xpress SARS-CoV-2/FLU/RSV plus assay is intended as an aid in the diagnosis of influenza from Nasopharyngeal swab specimens and should not be used as a sole basis for treatment. Nasal washings and aspirates are unacceptable for Xpert Xpress SARS-CoV-2/FLU/RSV testing.  Fact Sheet for Patients: EntrepreneurPulse.com.au  Fact Sheet for Healthcare Providers: IncredibleEmployment.be  This test is not yet approved or cleared by the [place] FDA and has been authorized for detection and/or diagnosis of SARS-CoV-2 by FDA under an Emergency Use Authorization (EUA). This EUA will remain in effect (meaning this test can be used) for the duration of the COVID-19 declaration under Section 564(b)(1) of the Act, 21 U.S.C. section 360bbb-3(b)(1), unless the authorization is terminated or revoked.  Performed at [facility], [address]  [place], [place] [address]   MRSA Next Gen by PCR, Nasal     Status: Abnormal   Collection Time: 12/23/21  2:23 PM   Specimen: Urine, Catheterized; Nasal Swab  Result Value Ref Range Status   MRSA by PCR Next Gen DETECTED (A) NOT DETECTED Final    Comment: RESULT CALLED TO, READ BACK BY AND VERIFIED WITH: Terrence Dupont RN @1710  12/23/21 SCS (NOTE) The GeneXpert MRSA Assay (FDA approved for NASAL specimens only), is one component of a comprehensive MRSA colonization surveillance program. It is not intended to diagnose MRSA infection nor to guide or monitor treatment for MRSA infections. Test performance is not FDA approved in patients less than 51 years old. Performed at [facility], [address] [place], [place] [address]   Culture, blood (x 2)     Status: None (Preliminary result)   Collection Time: 12/23/21  3:07 PM   Specimen: BLOOD LEFT HAND  Result Value Ref Range Status   Specimen Description BLOOD LEFT HAND  Final   Special Requests   Final    BOTTLES DRAWN AEROBIC AND ANAEROBIC Blood Culture adequate volume   Culture   Final    NO GROWTH < 24 HOURS  Performed at [facility], [address] [place], [place] [address]    Report Status PENDING  Incomplete  Culture, blood (x 2)     Status: None (Preliminary result)   Collection Time: 12/23/21  3:17 PM   Specimen: BLOOD LEFT ARM  Result Value Ref Range Status   Specimen Description BLOOD LEFT ARM  Final   Special Requests   Final    BOTTLES DRAWN AEROBIC AND ANAEROBIC Blood Culture adequate volume   Culture   Final    NO GROWTH < 24 HOURS Performed at [facility], [address] [place], [place] [address]    Report Status PENDING  Incomplete    Coagulation Studies: Recent Labs    12/23/21 1452  LABPROT 15.1  INR 1.2    Urinalysis: Recent Labs    12/23/21 1038  COLORURINE YELLOW  LABSPEC 1.025  PHURINE 5.5  GLUCOSEU NEGATIVE  HGBUR NEGATIVE  BILIRUBINUR NEGATIVE  KETONESUR  NEGATIVE  PROTEINUR NEGATIVE  NITRITE NEGATIVE  LEUKOCYTESUR NEGATIVE      Imaging: CT HEAD WO CONTRAST (5MM)  Result Date: 12/23/2021 CLINICAL DATA:  Abnormal intl status EXAM: CT HEAD WITHOUT CONTRAST TECHNIQUE: Contiguous axial images were obtained from the base of the skull through the vertex without intravenous contrast. RADIATION DOSE REDUCTION: This exam was performed according to the departmental dose-optimization program which includes automated exposure control, adjustment of the mA and/or kV according to patient size and/or use of iterative reconstruction technique. COMPARISON:  08/06/2019 FINDINGS: Brain: There is no acute intracranial hemorrhage, mass effect, or edema. Gray-white differentiation is preserved. There is no extra-axial fluid collection. Ventricles and sulci are within normal limits in size and configuration. Vascular: No hyperdense vessel or unexpected calcification. Skull: Calvarium is unremarkable. Sinuses/Orbits: No acute finding. Other: Scattered small foci of air in the soft tissues of the included face are probably intravenous. Mastoid air cells are clear. IMPRESSION: No acute intracranial abnormality. Electronically Signed   By: Macy Mis M.D.   On: 12/23/2021 15:19   CT CERVICAL SPINE WO CONTRAST  Result Date: 12/23/2021 CLINICAL DATA:  Sepsis, neck trauma, hypertension EXAM: CT CERVICAL SPINE WITHOUT CONTRAST TECHNIQUE: Multidetector CT imaging of the cervical spine was performed without intravenous contrast. Multiplanar CT image reconstructions were also generated. RADIATION DOSE REDUCTION: This exam was performed according to the departmental dose-optimization program which includes automated exposure control, adjustment of the mA and/or kV according to patient size and/or use of iterative reconstruction technique. COMPARISON:  08/06/2019 FINDINGS: Alignment: Alignment is anatomic. Skull base and vertebrae: No acute fracture. No primary bone lesion or focal  pathologic process. Soft tissues and spinal canal: No prevertebral fluid or swelling. No visible canal hematoma. Disc levels: Mild diffuse facet hypertrophy greatest on the left at C4-5, C5-6, and C6-7. Disc spaces are relatively well preserved. Upper chest: Airway is patent. Visualized portions of the lung apices are clear. Other: Reconstructed images demonstrate no additional findings. IMPRESSION: 1. No acute cervical spine fracture. Electronically Signed   By: Randa Ngo M.D.   On: 12/23/2021 15:23   DG Chest Portable 1 View  Result Date: 12/23/2021 CLINICAL DATA:  Hypertension. EXAM: PORTABLE CHEST 1 VIEW COMPARISON:  March 15, 2008 FINDINGS: Cardiomediastinal silhouette is normal. Mediastinal contours appear intact. Diffuse patchy airspace consolidation throughout both lungs, left greater than right. Low lung volumes. Osseous structures are without acute abnormality. Soft tissues are grossly normal. IMPRESSION: Diffuse patchy airspace consolidation throughout both lungs, left greater than right. These findings may represent mixed pattern  pulmonary edema or atypical pneumonia. Electronically Signed   By: Fidela Salisbury M.D.   On: 12/23/2021 12:40   CT CHEST ABDOMEN PELVIS WO CONTRAST  Result Date: 12/23/2021 CLINICAL DATA:  Possible sepsis EXAM: CT CHEST, ABDOMEN AND PELVIS WITHOUT CONTRAST TECHNIQUE: Multidetector CT imaging of the chest, abdomen and pelvis was performed following the standard protocol without IV contrast. RADIATION DOSE REDUCTION: This exam was performed according to the departmental dose-optimization program which includes automated exposure control, adjustment of the mA and/or kV according to patient size and/or use of iterative reconstruction technique. COMPARISON:  Chest x-ray from earlier in the same day. FINDINGS: CT CHEST FINDINGS Cardiovascular: Somewhat limited due to lack of IV contrast. Aortic calcifications are noted without aneurysmal dilatation or dissection. No  cardiac enlargement is noted. Coronary calcifications are seen. No significant enlargement of the pulmonary artery is noted. Mediastinum/Nodes: Thoracic inlet is within normal limits. No sizable hilar or mediastinal adenopathy is noted. The esophagus as visualized is within normal limits. Lungs/Pleura: Lungs are well aerated bilaterally without focal infiltrate or sizable effusion. Interstitial thickening is noted consistent with mild edema. These changes are similar to that seen on prior plain film examination. No pneumothorax is noted. Musculoskeletal: Degenerative changes of the thoracic spine are seen. No acute rib abnormality is noted. CT ABDOMEN PELVIS FINDINGS Hepatobiliary: No focal liver abnormality is seen. Status post cholecystectomy. No biliary dilatation. Pancreas: Unremarkable. No pancreatic ductal dilatation or surrounding inflammatory changes. Spleen: Normal in size without focal abnormality. Adrenals/Urinary Tract: Adrenal glands are within normal limits. Kidneys are well visualized bilaterally. No renal calculi or obstructive changes are seen. Exophytic lesion is noted from the posterior aspect of the left kidney. May represent a small cyst but is incompletely characterized on this exam. No ureteral obstructive changes are seen. The bladder is partially distended. Stomach/Bowel: The appendix is not well visualized and may have been surgically removed. No obstructive or inflammatory changes of the colon are seen. Stomach is within normal limits. No focal abnormality in the small bowel is seen. Vascular/Lymphatic: Aortic atherosclerosis. No enlarged abdominal or pelvic lymph nodes. Reproductive: Status post hysterectomy. No adnexal masses. Other: No abdominal wall hernia or abnormality. No abdominopelvic ascites. Musculoskeletal: Mild degenerative changes of lumbar spine are noted. No acute bony abnormality is seen. IMPRESSION: CT of the chest: Mild interstitial edema. No focal infiltrate is seen. CT  of the abdomen and pelvis: Exophytic lesion from the left kidney posteriorly which may represent a small cyst but incompletely evaluated on this exam. Ultrasound of the kidneys may be helpful as an initial evaluation technique when the patient's clinical symptomatology improves. No other acute abnormality is identified. Electronically Signed   By: Inez Catalina M.D.   On: 12/23/2021 19:43     Assessment & Plan: Ms. Jessicalynn Kingcade is a 66 y.o. white female with fibromyalgia, hypertension, arthritis, GERD, hyperlipidemia, hypothyroidism, sleep apnea, anxiety, depression who was admitted to [facility] on 12/23/2021 for Serum potassium elevated [E87.5] AKI (acute kidney injury) ([place]) [N17.9] Sepsis with hypotension ([place]) [A41.9, I95.9] Hypotension, unspecified hypotension type [I95.9]  Acute kidney injury on chronic kidney disease stage IIIA: baseline creatinine of 1.16, GFR of 59 on 08/06/19. Bland urine Hyperkalemia: with home use of spironolactone and lisinopril.  Metabolic acidosis: nonanion gap acidosis. With diarrhea.  Hypotension: suspect hypovolemic state along with pain medications and hypertensive agents.   Plan:  - check renal ultrasound  - Check viral hepatitis panel and SPEP/UPEP  - Continue IV bicarb gtt  - holding celecoxib  -  holding spironolactone, lisinopril and hydrochlorothiazide  - holding pregablin.     LOS: 1 Malashia Kamaka 1/16/20239:38 AM

## 2021-12-25 ENCOUNTER — Inpatient Hospital Stay (HOSPITAL_COMMUNITY)
Admit: 2021-12-25 | Discharge: 2021-12-25 | Disposition: A | Discharge status: Home / Self Care | Payer: Medicare Other | Attending: Pulmonary Disease | Admitting: Pulmonary Disease

## 2021-12-25 DIAGNOSIS — A419 Sepsis, unspecified organism: Secondary | ICD-10-CM

## 2021-12-25 DIAGNOSIS — I9589 Other hypotension: Secondary | ICD-10-CM

## 2021-12-25 DIAGNOSIS — I959 Hypotension, unspecified: Secondary | ICD-10-CM

## 2021-12-25 LAB — ECHOCARDIOGRAM COMPLETE
AR max vel: 3.18 cm2
AV Area VTI: 3.55 cm2
AV Area mean vel: 2.93 cm2
AV Mean grad: 4 mmHg
AV Peak grad: 6.7 mmHg
Ao pk vel: 1.29 m/s
Area-P 1/2: 3.51 cm2
Height: 69 in
MV VTI: 3.8 cm2
S' Lateral: 3 cm
Weight: 4832.48 oz

## 2021-12-25 LAB — CBC
HCT: 27.8 % — ABNORMAL LOW (ref 36.0–46.0)
Hemoglobin: 9 g/dL — ABNORMAL LOW (ref 12.0–15.0)
MCH: 27.1 pg (ref 26.0–34.0)
MCHC: 32.4 g/dL (ref 30.0–36.0)
MCV: 83.7 fL (ref 80.0–100.0)
Platelets: 134 10*3/uL — ABNORMAL LOW (ref 150–400)
RBC: 3.32 MIL/uL — ABNORMAL LOW (ref 3.87–5.11)
RDW: 13.8 % (ref 11.5–15.5)
WBC: 4.3 10*3/uL (ref 4.0–10.5)
nRBC: 0 % (ref 0.0–0.2)

## 2021-12-25 LAB — BASIC METABOLIC PANEL
Anion gap: 7 (ref 5–15)
BUN: <5 mg/dL — ABNORMAL LOW (ref 8–23)
CO2: 33 mmol/L — ABNORMAL HIGH (ref 22–32)
Calcium: 8.5 mg/dL — ABNORMAL LOW (ref 8.9–10.3)
Chloride: 104 mmol/L (ref 98–111)
Creatinine, Ser: 1.27 mg/dL — ABNORMAL HIGH (ref 0.44–1.00)
GFR, Estimated: 48 mL/min — ABNORMAL LOW (ref >60)
Glucose, Bld: 127 mg/dL — ABNORMAL HIGH (ref 70–99)
Potassium: 4.7 mmol/L (ref 3.5–5.1)
Sodium: 144 mmol/L (ref 135–145)

## 2021-12-25 LAB — PROTEIN ELECTROPHORESIS, SERUM
A/G Ratio: 1.4 (ref 0.7–1.7)
Albumin ELP: 3.1 g/dL (ref 2.9–4.4)
Alpha-1-Globulin: 0.2 g/dL (ref 0.0–0.4)
Alpha-2-Globulin: 0.7 g/dL (ref 0.4–1.0)
Beta Globulin: 0.7 g/dL (ref 0.7–1.3)
Gamma Globulin: 0.6 g/dL (ref 0.4–1.8)
Globulin, Total: 2.2 g/dL (ref 2.2–3.9)
Total Protein ELP: 5.3 g/dL — ABNORMAL LOW (ref 6.0–8.5)

## 2021-12-25 LAB — URINE CULTURE: Culture: NO GROWTH

## 2021-12-25 LAB — LEGIONELLA PNEUMOPHILA SEROGP 1 UR AG: L. pneumophila Serogp 1 Ur Ag: NEGATIVE

## 2021-12-25 LAB — KAPPA/LAMBDA LIGHT CHAINS
Kappa free light chain: 34.3 mg/L — ABNORMAL HIGH (ref 3.3–19.4)
Kappa, lambda light chain ratio: 1.67 — ABNORMAL HIGH (ref 0.26–1.65)
Lambda free light chains: 20.5 mg/L (ref 5.7–26.3)

## 2021-12-25 LAB — MAGNESIUM: Magnesium: 1.3 mg/dL — ABNORMAL LOW (ref 1.7–2.4)

## 2021-12-25 MED ORDER — TOPIRAMATE 25 MG PO TABS
50.0000 mg | ORAL_TABLET | Freq: Two times a day (BID) | ORAL | Status: DC
  Administered 2021-12-25 – 2021-12-27 (×5): 50 mg via ORAL
  Filled 2021-12-25 (×6): qty 2

## 2021-12-25 MED ORDER — TRAZODONE HCL 50 MG PO TABS
150.0000 mg | ORAL_TABLET | Freq: Every day | ORAL | Status: DC
  Administered 2021-12-25 – 2021-12-26 (×2): 150 mg via ORAL
  Filled 2021-12-25 (×2): qty 3

## 2021-12-25 MED ORDER — DM-GUAIFENESIN ER 30-600 MG PO TB12: 1.0000 | ORAL_TABLET | Freq: Two times a day (BID) | ORAL | Status: DC | PRN

## 2021-12-25 MED ORDER — ALPRAZOLAM 0.5 MG PO TABS: 0.5000 mg | ORAL_TABLET | Freq: Two times a day (BID) | ORAL | Status: DC | PRN

## 2021-12-25 MED ORDER — MUPIROCIN 2 % EX OINT
TOPICAL_OINTMENT | Freq: Two times a day (BID) | CUTANEOUS | Status: DC
  Filled 2021-12-25: qty 22

## 2021-12-25 MED ORDER — CELECOXIB 100 MG PO CAPS
100.0000 mg | ORAL_CAPSULE | Freq: Every day | ORAL | Status: DC
  Administered 2021-12-25 – 2021-12-26 (×2): 100 mg via ORAL
  Filled 2021-12-25 (×2): qty 1

## 2021-12-25 MED ORDER — DULOXETINE HCL 30 MG PO CPEP
60.0000 mg | ORAL_CAPSULE | Freq: Every day | ORAL | Status: DC
  Administered 2021-12-25 – 2021-12-27 (×3): 60 mg via ORAL
  Filled 2021-12-25 (×3): qty 2

## 2021-12-25 MED ORDER — PREGABALIN 75 MG PO CAPS
150.0000 mg | ORAL_CAPSULE | Freq: Two times a day (BID) | ORAL | Status: DC
  Administered 2021-12-25 – 2021-12-27 (×5): 150 mg via ORAL
  Filled 2021-12-25 (×5): qty 2

## 2021-12-25 MED ORDER — MAGNESIUM SULFATE 4 GM/100ML IV SOLN
4.0000 g | Freq: Once | INTRAVENOUS | Status: AC
  Administered 2021-12-25: 4 g via INTRAVENOUS
  Filled 2021-12-25: qty 100

## 2021-12-25 NOTE — Progress Notes (Signed)
[facility]  ROUNDING NOTE   Subjective:    Patient sitting up in bed States she feels slightly improved Poor appetite due to nausea  Creatinine 1.27 Urine output 4.25 L in past 24 hours IVF Sodium Bicarb @125  ml/hr  Objective:  Vital signs in last 24 hours:  Temp:  [97.8 F (36.6 C)-98.5 F (36.9 C)] 97.8 F (36.6 C) (01/17 0800) Pulse Rate:  [62-98] 80 (01/17 1100) Resp:  [4-24] 4 (01/17 1100) BP: (91-151)/(49-134) 122/78 (01/17 1100) SpO2:  [81 %-100 %] 100 % (01/17 1100) FiO2 (%):  [2 %] 2 % (01/17 0700) Weight:  [id number] kg] 137 kg (01/17 0500)  Weight change: -5 kg Filed Weights   12/23/21 1036 12/23/21 1437 12/25/21 0500  Weight: (!) 142 kg (!) 138.8 kg (!) 137 kg    Intake/Output: I/O last 3 completed shifts: In: 4225.5 [P.O.:200; I.V.:4025.5] Out: 8050 [Urine:8050]   Intake/Output this shift:  Total I/O In: -  Out: 500 [Urine:500]  Physical Exam: General: NAD  Head: Normocephalic, atraumatic. Moist oral mucosal membranes  Eyes: Anicteric  Lungs:  Clear to auscultation, normal effort  Heart: Regular rate and rhythm  Abdomen:  Soft, nontender  Extremities:  no peripheral edema.  Neurologic: Nonfocal, moving all four extremities  Skin: No lesions   Basic Metabolic Panel: Recent Labs  Lab 12/23/21 1044 12/23/21 1452 12/23/21 2352 12/24/21 0422 12/25/21 0636  NA 140 142  --  144 144  K 5.8* 6.0* 5.1 5.1 4.7  CL 114* 120*  --  117* 104  CO2 18* 17*  --  17* 33*  GLUCOSE 96 105*  --  88 127*  BUN 95* 84*  --  76* <5*  CREATININE 5.13* 4.55*  --  3.41* 1.27*  CALCIUM 8.8* 7.9*  --  8.5* 8.5*  MG  --   --  1.7 1.6* 1.3*  PHOS  --   --  5.6* 5.5*  --      Liver Function Tests: Recent Labs  Lab 12/23/21 1044 12/23/21 1452  AST 14* 13*  ALT 11 10  ALKPHOS 75 63  BILITOT 0.4 0.8  PROT 6.8 5.5*  ALBUMIN 3.8 3.2*    Recent Labs  Lab 12/23/21 1044  LIPASE 34    Recent Labs  Lab 12/23/21 1452  AMMONIA 30      CBC: Recent Labs  Lab 12/23/21 1044 12/24/21 0422 12/25/21 0636  WBC 4.8   4.7 6.2 4.3  NEUTROABS 2.8  --   --   HGB 9.9*   9.9* 9.9* 9.0*  HCT 31.3*   31.4* 31.3* 27.8*  MCV 87.2   87.0 86.2 83.7  PLT 196   198 140* 134*     Cardiac Enzymes: No results for input(s): CKTOTAL, CKMB, CKMBINDEX, TROPONINI in the last 168 hours.  BNP: Invalid input(s): POCBNP  CBG: Recent Labs  Lab 12/23/21 1429  GLUCAP 80     Microbiology: Results for orders placed or performed during the hospital encounter of 12/23/21  Resp Panel by RT-PCR (Flu A&B, Covid) Nasopharyngeal Swab     Status: None   Collection Time: 12/23/21  1:51 PM   Specimen: Nasopharyngeal Swab; Nasopharyngeal(NP) swabs in vial transport medium  Result Value Ref Range Status   SARS Coronavirus 2 by RT PCR NEGATIVE NEGATIVE Final    Comment: (NOTE) SARS-CoV-2 target nucleic acids are NOT DETECTED.  The SARS-CoV-2 RNA is generally detectable in upper respiratory specimens during the acute phase of infection. The lowest concentration of SARS-CoV-2 viral  copies this assay can detect is 138 copies/mL. A negative result does not preclude SARS-Cov-2 infection and should not be used as the sole basis for treatment or other patient management decisions. A negative result may occur with  improper specimen collection/handling, submission of specimen other than nasopharyngeal swab, presence of viral mutation(s) within the areas targeted by this assay, and inadequate number of viral copies(<138 copies/mL). A negative result must be combined with clinical observations, patient history, and epidemiological information. The expected result is Negative.  Fact Sheet for Patients:  EntrepreneurPulse.com.au  Fact Sheet for Healthcare Providers:  IncredibleEmployment.be  This test is no t yet approved or cleared by the [place] FDA and  has been authorized for detection and/or  diagnosis of SARS-CoV-2 by FDA under an Emergency Use Authorization (EUA). This EUA will remain  in effect (meaning this test can be used) for the duration of the COVID-19 declaration under Section 564(b)(1) of the Act, 21 U.S.C.section 360bbb-3(b)(1), unless the authorization is terminated  or revoked sooner.       Influenza A by PCR NEGATIVE NEGATIVE Final   Influenza B by PCR NEGATIVE NEGATIVE Final    Comment: (NOTE) The Xpert Xpress SARS-CoV-2/FLU/RSV plus assay is intended as an aid in the diagnosis of influenza from Nasopharyngeal swab specimens and should not be used as a sole basis for treatment. Nasal washings and aspirates are unacceptable for Xpert Xpress SARS-CoV-2/FLU/RSV testing.  Fact Sheet for Patients: EntrepreneurPulse.com.au  Fact Sheet for Healthcare Providers: IncredibleEmployment.be  This test is not yet approved or cleared by the [place] FDA and has been authorized for detection and/or diagnosis of SARS-CoV-2 by FDA under an Emergency Use Authorization (EUA). This EUA will remain in effect (meaning this test can be used) for the duration of the COVID-19 declaration under Section 564(b)(1) of the Act, 21 U.S.C. section 360bbb-3(b)(1), unless the authorization is terminated or revoked.  Performed at [facility], [address] [place], [place] [address]   MRSA Next Gen by PCR, Nasal     Status: Abnormal   Collection Time: 12/23/21  2:23 PM   Specimen: Urine, Catheterized; Nasal Swab  Result Value Ref Range Status   MRSA by PCR Next Gen DETECTED (A) NOT DETECTED Final    Comment: RESULT CALLED TO, READ BACK BY AND VERIFIED WITH: Terrence Dupont RN @1710  12/23/21 SCS (NOTE) The GeneXpert MRSA Assay (FDA approved for NASAL specimens only), is one component of a comprehensive MRSA colonization surveillance program. It is not intended to diagnose MRSA infection nor to guide or monitor treatment for MRSA  infections. Test performance is not FDA approved in patients less than 72 years old. Performed at [facility], [address] [place], [place] [address]   Culture, blood (x 2)     Status: None (Preliminary result)   Collection Time: 12/23/21  3:07 PM   Specimen: BLOOD LEFT HAND  Result Value Ref Range Status   Specimen Description BLOOD LEFT HAND  Final   Special Requests   Final    BOTTLES DRAWN AEROBIC AND ANAEROBIC Blood Culture adequate volume   Culture   Final    NO GROWTH 2 DAYS Performed at [facility], [address] [place], [place] [address]    Report Status PENDING  Incomplete  Culture, blood (x 2)     Status: None (Preliminary result)   Collection Time: 12/23/21  3:17 PM   Specimen: BLOOD LEFT ARM  Result Value Ref Range Status   Specimen Description BLOOD  LEFT ARM  Final   Special Requests   Final    BOTTLES DRAWN AEROBIC AND ANAEROBIC Blood Culture adequate volume   Culture   Final    NO GROWTH 2 DAYS Performed at [facility], [address] [place], [place] [address]    Report Status PENDING  Incomplete    Coagulation Studies: Recent Labs    12/23/21 1452  LABPROT 15.1  INR 1.2     Urinalysis: Recent Labs    12/23/21 1038  COLORURINE YELLOW  LABSPEC 1.025  PHURINE 5.5  GLUCOSEU NEGATIVE  HGBUR NEGATIVE  BILIRUBINUR NEGATIVE  KETONESUR NEGATIVE  PROTEINUR NEGATIVE  NITRITE NEGATIVE  LEUKOCYTESUR NEGATIVE       Imaging: CT HEAD WO CONTRAST (5MM)  Result Date: 12/23/2021 CLINICAL DATA:  Abnormal intl status EXAM: CT HEAD WITHOUT CONTRAST TECHNIQUE: Contiguous axial images were obtained from the base of the skull through the vertex without intravenous contrast. RADIATION DOSE REDUCTION: This exam was performed according to the departmental dose-optimization program which includes automated exposure control, adjustment of the mA and/or kV according to patient size and/or use of iterative reconstruction  technique. COMPARISON:  08/06/2019 FINDINGS: Brain: There is no acute intracranial hemorrhage, mass effect, or edema. Gray-white differentiation is preserved. There is no extra-axial fluid collection. Ventricles and sulci are within normal limits in size and configuration. Vascular: No hyperdense vessel or unexpected calcification. Skull: Calvarium is unremarkable. Sinuses/Orbits: No acute finding. Other: Scattered small foci of air in the soft tissues of the included face are probably intravenous. Mastoid air cells are clear. IMPRESSION: No acute intracranial abnormality. Electronically Signed   By: Macy Mis M.D.   On: 12/23/2021 15:19   CT CERVICAL SPINE WO CONTRAST  Result Date: 12/23/2021 CLINICAL DATA:  Sepsis, neck trauma, hypertension EXAM: CT CERVICAL SPINE WITHOUT CONTRAST TECHNIQUE: Multidetector CT imaging of the cervical spine was performed without intravenous contrast. Multiplanar CT image reconstructions were also generated. RADIATION DOSE REDUCTION: This exam was performed according to the departmental dose-optimization program which includes automated exposure control, adjustment of the mA and/or kV according to patient size and/or use of iterative reconstruction technique. COMPARISON:  08/06/2019 FINDINGS: Alignment: Alignment is anatomic. Skull base and vertebrae: No acute fracture. No primary bone lesion or focal pathologic process. Soft tissues and spinal canal: No prevertebral fluid or swelling. No visible canal hematoma. Disc levels: Mild diffuse facet hypertrophy greatest on the left at C4-5, C5-6, and C6-7. Disc spaces are relatively well preserved. Upper chest: Airway is patent. Visualized portions of the lung apices are clear. Other: Reconstructed images demonstrate no additional findings. IMPRESSION: 1. No acute cervical spine fracture. Electronically Signed   By: Randa Ngo M.D.   On: 12/23/2021 15:23   US RENAL  Result Date: 12/24/2021 CLINICAL DATA:  Acute renal injury  EXAM: RENAL / URINARY TRACT ULTRASOUND COMPLETE COMPARISON:  None. FINDINGS: Right Kidney: Renal measurements: 11.7 by 5.4 x 5.2 cm = volume: 165 mL. Echogenicity within normal limits. No mass or hydronephrosis visualized. Left Kidney: Renal measurements: 11.2 x 5.7 x 4.9 cm = volume: 164 mL. Echogenicity within normal limits. No mass or hydronephrosis visualized. Anechoic cysts measuring 2.4 cm in the mid RIGHT renal cortex. Bladder: Collapsed around Foley catheter. Other: None. IMPRESSION: 1. No hydronephrosis. 2. Benign appearing LEFT renal cyst. Electronically Signed   By: Suzy Bouchard M.D.   On: 12/24/2021 12:27   DG Chest Portable 1 View  Result Date: 12/23/2021 CLINICAL DATA:  Hypertension. EXAM: PORTABLE CHEST 1 VIEW COMPARISON:  March 15, 2008 FINDINGS: Cardiomediastinal silhouette is normal. Mediastinal contours appear intact. Diffuse patchy airspace consolidation throughout both lungs, left greater than right. Low lung volumes. Osseous structures are without acute abnormality. Soft tissues are grossly normal. IMPRESSION: Diffuse patchy airspace consolidation throughout both lungs, left greater than right. These findings may represent mixed pattern pulmonary edema or atypical pneumonia. Electronically Signed   By: Fidela Salisbury M.D.   On: 12/23/2021 12:40   ECHOCARDIOGRAM COMPLETE  Result Date: 12/25/2021    ECHOCARDIOGRAM REPORT   Patient Name:   Sharon Boyd Date of Exam: 12/25/2021 Medical Rec #:  [id number]         Height:       69.0 in Accession #:    [id number]        Weight:       302.0 lb Date of Birth:  10/24/1956         BSA:          2.462 m Patient Age:    84 years          BP:           114/59 mmHg Patient Gender: F                 HR:           70 bpm. Exam Location:  [facility] Procedure: 2D Echo, Cardiac Doppler and Color Doppler Indications:     Shock  History:         Patient has prior history of Echocardiogram examinations, most                  recent 03/28/2016. Risk  Factors:Hypertension.  Sonographer:     Sherrie Sport Referring Phys:  [id number] Bradly Bienenstock Diagnosing Phys: Nelva Bush MD  Sonographer Comments: Suboptimal apical window and Technically challenging study due to limited acoustic windows. IMPRESSIONS  1. Left ventricular ejection fraction, by estimation, is >55%. The left ventricle has normal function. Left ventricular endocardial border not optimally defined to evaluate regional wall motion. Left ventricular diastolic parameters were normal.  2. Right ventricular systolic function is normal. The right ventricular size is mildly enlarged. Tricuspid regurgitation signal is inadequate for assessing PA pressure.  3. The mitral valve is grossly normal. No evidence of mitral valve regurgitation. No evidence of mitral stenosis.  4. The aortic valve was not well visualized. Aortic valve regurgitation is not visualized. No aortic stenosis is present. FINDINGS  Left Ventricle: Left ventricular ejection fraction, by estimation, is >55%. The left ventricle has normal function. Left ventricular endocardial border not optimally defined to evaluate regional wall motion. The left ventricular internal cavity size was  normal in size. There is borderline left ventricular hypertrophy. Left ventricular diastolic parameters were normal. Right Ventricle: The right ventricular size is mildly enlarged. No increase in right ventricular wall thickness. Right ventricular systolic function is normal. Tricuspid regurgitation signal is inadequate for assessing PA pressure. Left Atrium: Left atrial size was normal in size. Right Atrium: Right atrial size was normal in size. Pericardium: The pericardium was not well visualized. Mitral Valve: The mitral valve is grossly normal. No evidence of mitral valve regurgitation. No evidence of mitral valve stenosis. MV peak gradient, 3.8 mmHg. The mean mitral valve gradient is 2.0 mmHg. Tricuspid Valve: The tricuspid valve is not well visualized.  Tricuspid valve regurgitation is trivial. Aortic Valve: The aortic valve was not well visualized. Aortic valve regurgitation is not visualized. No aortic stenosis is present. Aortic valve  mean gradient measures 4.0 mmHg. Aortic valve peak gradient measures 6.7 mmHg. Aortic valve area, by VTI measures 3.55 cm. Pulmonic Valve: The pulmonic valve was not well visualized. Pulmonic valve regurgitation is not visualized. No evidence of pulmonic stenosis. Aorta: The aortic root is normal in size and structure. Pulmonary Artery: The pulmonary artery is of normal size. Venous: The inferior vena cava was not well visualized. IAS/Shunts: The interatrial septum was not well visualized.  LEFT VENTRICLE PLAX 2D LVIDd:         4.20 cm   Diastology LVIDs:         3.00 cm   LV e' medial:    7.62 cm/s LV PW:         1.07 cm   LV E/e' medial:  11.3 LV IVS:        0.89 cm   LV e' lateral:   10.80 cm/s LVOT diam:     2.20 cm   LV E/e' lateral: 8.0 LV SV:         87 LV SV Index:   35 LVOT Area:     3.80 cm  RIGHT VENTRICLE RV Basal diam:  4.20 cm RV S prime:     15.90 cm/s TAPSE (M-mode): 3.0 cm LEFT ATRIUM           Index        RIGHT ATRIUM           Index LA diam:      2.70 cm 1.10 cm/m   RA Area:     10.90 cm LA Vol (A4C): 44.0 ml 17.87 ml/m  RA Volume:   23.50 ml  9.55 ml/m  AORTIC VALVE                    PULMONIC VALVE AV Area (Vmax):    3.18 cm     PV Vmax:        0.74 m/s AV Area (Vmean):   2.93 cm     PV Vmean:       50.300 cm/s AV Area (VTI):     3.55 cm     PV VTI:         0.129 m AV Vmax:           129.00 cm/s  PV Peak grad:   2.2 mmHg AV Vmean:          87.300 cm/s  PV Mean grad:   1.0 mmHg AV VTI:            0.244 m      RVOT Peak grad: 3 mmHg AV Peak Grad:      6.7 mmHg AV Mean Grad:      4.0 mmHg LVOT Vmax:         108.00 cm/s LVOT Vmean:        67.200 cm/s LVOT VTI:          0.228 m LVOT/AV VTI ratio: 0.93  AORTA Ao Root diam: 3.70 cm MITRAL VALVE MV Area (PHT): 3.51 cm    SHUNTS MV Area VTI:   3.80 cm     Systemic VTI:  0.23 m MV Peak grad:  3.8 mmHg    Systemic Diam: 2.20 cm MV Mean grad:  2.0 mmHg    Pulmonic VTI:  0.185 m MV Vmax:       0.98 m/s MV Vmean:      62.6 cm/s MV Decel Time: 216 msec MV E velocity: 86.10 cm/s MV A  velocity: 77.10 cm/s MV E/A ratio:  1.12 Nelva Bush MD Electronically signed by Nelva Bush MD Signature Date/Time: 12/25/2021/10:38:21 AM    Final    CT CHEST ABDOMEN PELVIS WO CONTRAST  Result Date: 12/23/2021 CLINICAL DATA:  Possible sepsis EXAM: CT CHEST, ABDOMEN AND PELVIS WITHOUT CONTRAST TECHNIQUE: Multidetector CT imaging of the chest, abdomen and pelvis was performed following the standard protocol without IV contrast. RADIATION DOSE REDUCTION: This exam was performed according to the departmental dose-optimization program which includes automated exposure control, adjustment of the mA and/or kV according to patient size and/or use of iterative reconstruction technique. COMPARISON:  Chest x-ray from earlier in the same day. FINDINGS: CT CHEST FINDINGS Cardiovascular: Somewhat limited due to lack of IV contrast. Aortic calcifications are noted without aneurysmal dilatation or dissection. No cardiac enlargement is noted. Coronary calcifications are seen. No significant enlargement of the pulmonary artery is noted. Mediastinum/Nodes: Thoracic inlet is within normal limits. No sizable hilar or mediastinal adenopathy is noted. The esophagus as visualized is within normal limits. Lungs/Pleura: Lungs are well aerated bilaterally without focal infiltrate or sizable effusion. Interstitial thickening is noted consistent with mild edema. These changes are similar to that seen on prior plain film examination. No pneumothorax is noted. Musculoskeletal: Degenerative changes of the thoracic spine are seen. No acute rib abnormality is noted. CT ABDOMEN PELVIS FINDINGS Hepatobiliary: No focal liver abnormality is seen. Status post cholecystectomy. No biliary dilatation. Pancreas:  Unremarkable. No pancreatic ductal dilatation or surrounding inflammatory changes. Spleen: Normal in size without focal abnormality. Adrenals/Urinary Tract: Adrenal glands are within normal limits. Kidneys are well visualized bilaterally. No renal calculi or obstructive changes are seen. Exophytic lesion is noted from the posterior aspect of the left kidney. May represent a small cyst but is incompletely characterized on this exam. No ureteral obstructive changes are seen. The bladder is partially distended. Stomach/Bowel: The appendix is not well visualized and may have been surgically removed. No obstructive or inflammatory changes of the colon are seen. Stomach is within normal limits. No focal abnormality in the small bowel is seen. Vascular/Lymphatic: Aortic atherosclerosis. No enlarged abdominal or pelvic lymph nodes. Reproductive: Status post hysterectomy. No adnexal masses. Other: No abdominal wall hernia or abnormality. No abdominopelvic ascites. Musculoskeletal: Mild degenerative changes of lumbar spine are noted. No acute bony abnormality is seen. IMPRESSION: CT of the chest: Mild interstitial edema. No focal infiltrate is seen. CT of the abdomen and pelvis: Exophytic lesion from the left kidney posteriorly which may represent a small cyst but incompletely evaluated on this exam. Ultrasound of the kidneys may be helpful as an initial evaluation technique when the patient's clinical symptomatology improves. No other acute abnormality is identified. Electronically Signed   By: Inez Catalina M.D.   On: 12/23/2021 19:43     Medications:    sodium chloride Stopped (12/24/21 0020)   magnesium sulfate bolus IVPB 4 g (12/25/21 1018)    Chlorhexidine Gluconate Cloth  6 each Topical Daily   heparin injection (subcutaneous)  5,000 Units Subcutaneous Q8H   mouth rinse  15 mL Mouth Rinse BID   mupirocin ointment   Nasal BID   acetaminophen, dextromethorphan-guaiFENesin, docusate sodium, ondansetron  (ZOFRAN) IV, polyethylene glycol  Assessment/ Plan:  Ms. Sharon Boyd is a 66 y.o.  female with fibromyalgia, hypertension, arthritis, GERD, hyperlipidemia, hypothyroidism, sleep apnea, anxiety, depression who was admitted to [facility] on 12/23/2021 for Serum potassium elevated [E87.5] AKI (acute kidney injury) ([place]) [N17.9] Sepsis with hypotension ([place]) [A41.9, I95.9] Hypotension, unspecified hypotension  type [I95.9]   Acute Kidney Injury with hyperkalemia on chronic kidney disease stage IIIA with baseline creatinine 1.16 and GFR of 59 on 08/06/19.  Lisinopril, hydrochlorothiazide and spironolactone held Renal ultrasound negative for obstruction and benign left renal cyst No indication of dialysis at this time.  -Potassium 4.7. -Creatinine improved to 1.2 with adequate urine output of 4.2 L. -Hep B studies nonreactive, UPEP negative -We will DC IV fluids, encourage oral intake  -Continue to avoid nephrotoxic agents.  Lab Results  Component Value Date   CREATININE 1.27 (H) 12/25/2021   CREATININE 3.41 (H) 12/24/2021   CREATININE 4.55 (H) 12/23/2021    Intake/Output Summary (Last 24 hours) at 12/25/2021 1124 Last data filed at 12/25/2021 1100 Gross per 24 hour  Intake 4143.73 ml  Output 3150 ml  Net 993.73 ml    2.  Metabolic acidosis with non-anion gap acidosis.  Likely due to diarrhea.  Resolved with IV sodium bicarb infusion.  We will stop IV fluids.  3.  Hypotension due to suspected hypovolemic state.  BP currently 122/78    LOS: 2 Samual Beals 1/17/202311:24 AM

## 2021-12-25 NOTE — Progress Notes (Signed)
1350 PATIENT MOVED TO ROOM 112 VIA BED. AFTER REPORT CALLED TO PAISLEY RN ON 1C.

## 2021-12-25 NOTE — Progress Notes (Signed)
*  PRELIMINARY RESULTS* Echocardiogram 2D Echocardiogram has been performed.  Sharon Boyd 12/25/2021, 8:31 AM

## 2021-12-25 NOTE — Progress Notes (Incomplete)
[facility]  ROUNDING NOTE   Subjective:    Patient sitting up in bed States she feels slightly improved Poor appetite due to nausea  Creatinine 1.27 Urine output 4.25 L in past 24 hours  Objective:  Vital signs in last 24 hours:  Temp:  [97.8 F (36.6 C)-98.5 F (36.9 C)] 97.8 F (36.6 C) (01/17 0800) Pulse Rate:  [51-98] 63 (01/17 0800) Resp:  [11-24] 13 (01/17 0800) BP: (74-151)/(49-134) 110/63 (01/17 0800) SpO2:  [81 %-100 %] 100 % (01/17 0800) FiO2 (%):  [2 %] 2 % (01/17 0700) Weight:  HU:[id number] kg] 137 kg (01/17 0500)  Weight change: -5 kg Filed Weights   12/23/21 1036 12/23/21 1437 12/25/21 0500  Weight: (!) 142 kg (!) 138.8 kg (!) 137 kg    Intake/Output: I/O last 3 completed shifts: In: 4225.5 [P.O.:200; I.V.:4025.5] Out: 8050 [Urine:8050]   Intake/Output this shift:  No intake/output data recorded.  Physical Exam: General: NAD,   Head: Normocephalic, atraumatic. Moist oral mucosal membranes  Eyes: Anicteric  Lungs:  Clear to auscultation, normal effort  Heart: Regular rate and rhythm  Abdomen:  Soft, nontender  Extremities:  no peripheral edema.  Neurologic: Nonfocal, moving all four extremities  Skin: No lesions   Basic Metabolic Panel: Recent Labs  Lab 12/23/21 1044 12/23/21 1452 12/23/21 2352 12/24/21 0422 12/25/21 0636  NA 140 142  --  144 144  K 5.8* 6.0* 5.1 5.1 4.7  CL 114* 120*  --  117* 104  CO2 18* 17*  --  17* 33*  GLUCOSE 96 105*  --  88 127*  BUN 95* 84*  --  76* <5*  CREATININE 5.13* 4.55*  --  3.41* 1.27*  CALCIUM 8.8* 7.9*  --  8.5* 8.5*  MG  --   --  1.7 1.6* 1.3*  PHOS  --   --  5.6* 5.5*  --     Liver Function Tests: Recent Labs  Lab 12/23/21 1044 12/23/21 1452  AST 14* 13*  ALT 11 10  ALKPHOS 75 63  BILITOT 0.4 0.8  PROT 6.8 5.5*  ALBUMIN 3.8 3.2*   Recent Labs  Lab 12/23/21 1044  LIPASE 34   Recent Labs  Lab 12/23/21 1452  AMMONIA 30    CBC: Recent Labs  Lab 12/23/21 1044  12/24/21 0422 12/25/21 0636  WBC 4.8   4.7 6.2 4.3  NEUTROABS 2.8  --   --   HGB 9.9*   9.9* 9.9* 9.0*  HCT 31.3*   31.4* 31.3* 27.8*  MCV 87.2   87.0 86.2 83.7  PLT 196   198 140* 134*    Cardiac Enzymes: No results for input(s): CKTOTAL, CKMB, CKMBINDEX, TROPONINI in the last 168 hours.  BNP: Invalid input(s): POCBNP  CBG: Recent Labs  Lab 12/23/21 1429  GLUCAP 80    Microbiology: Results for orders placed or performed during the hospital encounter of 12/23/21  Resp Panel by RT-PCR (Flu A&B, Covid) Nasopharyngeal Swab     Status: None   Collection Time: 12/23/21  1:51 PM   Specimen: Nasopharyngeal Swab; Nasopharyngeal(NP) swabs in vial transport medium  Result Value Ref Range Status   SARS Coronavirus 2 by RT PCR NEGATIVE NEGATIVE Final    Comment: (NOTE) SARS-CoV-2 target nucleic acids are NOT DETECTED.  The SARS-CoV-2 RNA is generally detectable in upper respiratory specimens during the acute phase of infection. The lowest concentration of SARS-CoV-2 viral copies this assay can detect is 138 copies/mL. A negative result does not preclude  SARS-Cov-2 infection and should not be used as the sole basis for treatment or other patient management decisions. A negative result may occur with  improper specimen collection/handling, submission of specimen other than nasopharyngeal swab, presence of viral mutation(s) within the areas targeted by this assay, and inadequate number of viral copies(<138 copies/mL). A negative result must be combined with clinical observations, patient history, and epidemiological information. The expected result is Negative.  Fact Sheet for Patients:  EntrepreneurPulse.com.au  Fact Sheet for Healthcare Providers:  IncredibleEmployment.be  This test is no t yet approved or cleared by the [place] FDA and  has been authorized for detection and/or diagnosis of SARS-CoV-2 by FDA under an Emergency Use  Authorization (EUA). This EUA will remain  in effect (meaning this test can be used) for the duration of the COVID-19 declaration under Section 564(b)(1) of the Act, 21 U.S.C.section 360bbb-3(b)(1), unless the authorization is terminated  or revoked sooner.       Influenza A by PCR NEGATIVE NEGATIVE Final   Influenza B by PCR NEGATIVE NEGATIVE Final    Comment: (NOTE) The Xpert Xpress SARS-CoV-2/FLU/RSV plus assay is intended as an aid in the diagnosis of influenza from Nasopharyngeal swab specimens and should not be used as a sole basis for treatment. Nasal washings and aspirates are unacceptable for Xpert Xpress SARS-CoV-2/FLU/RSV testing.  Fact Sheet for Patients: EntrepreneurPulse.com.au  Fact Sheet for Healthcare Providers: IncredibleEmployment.be  This test is not yet approved or cleared by the [place] FDA and has been authorized for detection and/or diagnosis of SARS-CoV-2 by FDA under an Emergency Use Authorization (EUA). This EUA will remain in effect (meaning this test can be used) for the duration of the COVID-19 declaration under Section 564(b)(1) of the Act, 21 U.S.C. section 360bbb-3(b)(1), unless the authorization is terminated or revoked.  Performed at [facility], [address] [place], [place] [address]   MRSA Next Gen by PCR, Nasal     Status: Abnormal   Collection Time: 12/23/21  2:23 PM   Specimen: Urine, Catheterized; Nasal Swab  Result Value Ref Range Status   MRSA by PCR Next Gen DETECTED (A) NOT DETECTED Final    Comment: RESULT CALLED TO, READ BACK BY AND VERIFIED WITH: Terrence Dupont RN @1710  12/23/21 SCS (NOTE) The GeneXpert MRSA Assay (FDA approved for NASAL specimens only), is one component of a comprehensive MRSA colonization surveillance program. It is not intended to diagnose MRSA infection nor to guide or monitor treatment for MRSA infections. Test performance is not FDA approved in  patients less than 2 years old. Performed at [facility], [address] [place], [place] [address]   Culture, blood (x 2)     Status: None (Preliminary result)   Collection Time: 12/23/21  3:07 PM   Specimen: BLOOD LEFT HAND  Result Value Ref Range Status   Specimen Description BLOOD LEFT HAND  Final   Special Requests   Final    BOTTLES DRAWN AEROBIC AND ANAEROBIC Blood Culture adequate volume   Culture   Final    NO GROWTH 2 DAYS Performed at [facility], [address], [place], [place] [address]    Report Status PENDING  Incomplete  Culture, blood (x 2)     Status: None (Preliminary result)   Collection Time: 12/23/21  3:17 PM   Specimen: BLOOD LEFT ARM  Result Value Ref Range Status   Specimen Description BLOOD LEFT ARM  Final   Special Requests   Final  BOTTLES DRAWN AEROBIC AND ANAEROBIC Blood Culture adequate volume   Culture   Final    NO GROWTH 2 DAYS Performed at [facility], [address] [place], [place] [address]    Report Status PENDING  Incomplete    Coagulation Studies: Recent Labs    12/23/21 1452  LABPROT 15.1  INR 1.2    Urinalysis: Recent Labs    12/23/21 [place]  LABSPEC 1.025  PHURINE 5.5  GLUCOSEU NEGATIVE  HGBUR NEGATIVE  BILIRUBINUR NEGATIVE  KETONESUR NEGATIVE  PROTEINUR NEGATIVE  NITRITE NEGATIVE  LEUKOCYTESUR NEGATIVE      Imaging: CT HEAD WO CONTRAST (5MM)  Result Date: 12/23/2021 CLINICAL DATA:  Abnormal intl status EXAM: CT HEAD WITHOUT CONTRAST TECHNIQUE: Contiguous axial images were obtained from the base of the skull through the vertex without intravenous contrast. RADIATION DOSE REDUCTION: This exam was performed according to the departmental dose-optimization program which includes automated exposure control, adjustment of the mA and/or kV according to patient size and/or use of iterative reconstruction technique. COMPARISON:  08/06/2019 FINDINGS: Brain: There is no  acute intracranial hemorrhage, mass effect, or edema. Gray-white differentiation is preserved. There is no extra-axial fluid collection. Ventricles and sulci are within normal limits in size and configuration. Vascular: No hyperdense vessel or unexpected calcification. Skull: Calvarium is unremarkable. Sinuses/Orbits: No acute finding. Other: Scattered small foci of air in the soft tissues of the included face are probably intravenous. Mastoid air cells are clear. IMPRESSION: No acute intracranial abnormality. Electronically Signed   By: Macy Mis M.D.   On: 12/23/2021 15:19   CT CERVICAL SPINE WO CONTRAST  Result Date: 12/23/2021 CLINICAL DATA:  Sepsis, neck trauma, hypertension EXAM: CT CERVICAL SPINE WITHOUT CONTRAST TECHNIQUE: Multidetector CT imaging of the cervical spine was performed without intravenous contrast. Multiplanar CT image reconstructions were also generated. RADIATION DOSE REDUCTION: This exam was performed according to the departmental dose-optimization program which includes automated exposure control, adjustment of the mA and/or kV according to patient size and/or use of iterative reconstruction technique. COMPARISON:  08/06/2019 FINDINGS: Alignment: Alignment is anatomic. Skull base and vertebrae: No acute fracture. No primary bone lesion or focal pathologic process. Soft tissues and spinal canal: No prevertebral fluid or swelling. No visible canal hematoma. Disc levels: Mild diffuse facet hypertrophy greatest on the left at C4-5, C5-6, and C6-7. Disc spaces are relatively well preserved. Upper chest: Airway is patent. Visualized portions of the lung apices are clear. Other: Reconstructed images demonstrate no additional findings. IMPRESSION: 1. No acute cervical spine fracture. Electronically Signed   By: Randa Ngo M.D.   On: 12/23/2021 15:23   US RENAL  Result Date: 12/24/2021 CLINICAL DATA:  Acute renal injury EXAM: RENAL / URINARY TRACT ULTRASOUND COMPLETE COMPARISON:   None. FINDINGS: Right Kidney: Renal measurements: 11.7 by 5.4 x 5.2 cm = volume: 165 mL. Echogenicity within normal limits. No mass or hydronephrosis visualized. Left Kidney: Renal measurements: 11.2 x 5.7 x 4.9 cm = volume: 164 mL. Echogenicity within normal limits. No mass or hydronephrosis visualized. Anechoic cysts measuring 2.4 cm in the mid RIGHT renal cortex. Bladder: Collapsed around Foley catheter. Other: None. IMPRESSION: 1. No hydronephrosis. 2. Benign appearing LEFT renal cyst. Electronically Signed   By: Suzy Bouchard M.D.   On: 12/24/2021 12:27   DG Chest Portable 1 View  Result Date: 12/23/2021 CLINICAL DATA:  Hypertension. EXAM: PORTABLE CHEST 1 VIEW COMPARISON:  March 15, 2008 FINDINGS: Cardiomediastinal silhouette is normal. Mediastinal contours appear intact. Diffuse patchy airspace  consolidation throughout both lungs, left greater than right. Low lung volumes. Osseous structures are without acute abnormality. Soft tissues are grossly normal. IMPRESSION: Diffuse patchy airspace consolidation throughout both lungs, left greater than right. These findings may represent mixed pattern pulmonary edema or atypical pneumonia. Electronically Signed   By: Fidela Salisbury M.D.   On: 12/23/2021 12:40   CT CHEST ABDOMEN PELVIS WO CONTRAST  Result Date: 12/23/2021 CLINICAL DATA:  Possible sepsis EXAM: CT CHEST, ABDOMEN AND PELVIS WITHOUT CONTRAST TECHNIQUE: Multidetector CT imaging of the chest, abdomen and pelvis was performed following the standard protocol without IV contrast. RADIATION DOSE REDUCTION: This exam was performed according to the departmental dose-optimization program which includes automated exposure control, adjustment of the mA and/or kV according to patient size and/or use of iterative reconstruction technique. COMPARISON:  Chest x-ray from earlier in the same day. FINDINGS: CT CHEST FINDINGS Cardiovascular: Somewhat limited due to lack of IV contrast. Aortic calcifications  are noted without aneurysmal dilatation or dissection. No cardiac enlargement is noted. Coronary calcifications are seen. No significant enlargement of the pulmonary artery is noted. Mediastinum/Nodes: Thoracic inlet is within normal limits. No sizable hilar or mediastinal adenopathy is noted. The esophagus as visualized is within normal limits. Lungs/Pleura: Lungs are well aerated bilaterally without focal infiltrate or sizable effusion. Interstitial thickening is noted consistent with mild edema. These changes are similar to that seen on prior plain film examination. No pneumothorax is noted. Musculoskeletal: Degenerative changes of the thoracic spine are seen. No acute rib abnormality is noted. CT ABDOMEN PELVIS FINDINGS Hepatobiliary: No focal liver abnormality is seen. Status post cholecystectomy. No biliary dilatation. Pancreas: Unremarkable. No pancreatic ductal dilatation or surrounding inflammatory changes. Spleen: Normal in size without focal abnormality. Adrenals/Urinary Tract: Adrenal glands are within normal limits. Kidneys are well visualized bilaterally. No renal calculi or obstructive changes are seen. Exophytic lesion is noted from the posterior aspect of the left kidney. May represent a small cyst but is incompletely characterized on this exam. No ureteral obstructive changes are seen. The bladder is partially distended. Stomach/Bowel: The appendix is not well visualized and may have been surgically removed. No obstructive or inflammatory changes of the colon are seen. Stomach is within normal limits. No focal abnormality in the small bowel is seen. Vascular/Lymphatic: Aortic atherosclerosis. No enlarged abdominal or pelvic lymph nodes. Reproductive: Status post hysterectomy. No adnexal masses. Other: No abdominal wall hernia or abnormality. No abdominopelvic ascites. Musculoskeletal: Mild degenerative changes of lumbar spine are noted. No acute bony abnormality is seen. IMPRESSION: CT of the  chest: Mild interstitial edema. No focal infiltrate is seen. CT of the abdomen and pelvis: Exophytic lesion from the left kidney posteriorly which may represent a small cyst but incompletely evaluated on this exam. Ultrasound of the kidneys may be helpful as an initial evaluation technique when the patient's clinical symptomatology improves. No other acute abnormality is identified. Electronically Signed   By: Inez Catalina M.D.   On: 12/23/2021 19:43     Medications:    sodium chloride Stopped (12/24/21 0020)    Chlorhexidine Gluconate Cloth  6 each Topical Daily   heparin injection (subcutaneous)  5,000 Units Subcutaneous Q8H   mouth rinse  15 mL Mouth Rinse BID   midodrine  5 mg Oral TID WC   acetaminophen, docusate sodium, ondansetron (ZOFRAN) IV, polyethylene glycol  Assessment/ Plan:  Sharon Boyd is a 66 y.o.  female with fibromyalgia, hypertension, arthritis, GERD, hyperlipidemia, hypothyroidism, sleep apnea, anxiety, depression who was admitted to  [facility] on 12/23/2021 for Serum potassium elevated [E87.5] AKI (acute kidney injury) ([place]) [N17.9] Sepsis with hypotension ([place]) [A41.9, I95.9] Hypotension, unspecified hypotension type [I95.9]   Acute Kidney Injury on chronic kidney disease stage *** with baseline creatinine *** and GFR of *** on date.  Acute kidney injury complicated by *** Acute kidney injury secondary to  Chronic kidney disease is secondary to *** Chronic kidney disease is complicated by *** ACE-I/ARB Renal ultrasound  IV contrast exposure Dialysis indication.   Lab Results  Component Value Date   CREATININE 1.27 (H) 12/25/2021   CREATININE 3.41 (H) 12/24/2021   CREATININE 4.55 (H) 12/23/2021    Intake/Output Summary (Last 24 hours) at 12/25/2021 0915 Last data filed at 12/25/2021 0634 Gross per 24 hour  Intake 4143.73 ml  Output 3000 ml  Net 1143.73 ml       LOS: 2 Dante Cooter 1/17/20239:15 AM

## 2021-12-25 NOTE — Progress Notes (Signed)
PHARMACY CONSULT NOTE- Follow Up  Pharmacy Consult for Electrolyte Monitoring and Replacement   Recent Labs: Potassium (mmol/L)  Date Value  12/25/2021 4.7  12/16/2013 3.9   Magnesium (mg/dL)  Date Value  87/68/1157 1.3 (L)  12/15/2013 2.0   Calcium (mg/dL)  Date Value  26/20/3559 8.5 (L)   Calcium, Total (mg/dL)  Date Value  74/16/3845 8.3 (L)   Albumin (g/dL)  Date Value  36/46/8032 3.2 (L)  12/12/2013 3.3 (L)   Phosphorus (mg/dL)  Date Value  12/01/8249 5.5 (H)   Sodium (mmol/L)  Date Value  12/25/2021 144  12/16/2013 141   Assessment: 66 y.o. female w/ PMH of HTN, fibromyalgia admitted 12/23/21 for creatinine and potassium that are markedly elevated. Pharmacy is asked to follow and replace electrolytes while the patient is in the CCU.  AKI resolving. Hyperkalemia resolved. Acidosis resolved. Bicarb gtt stopped.   Goal of Therapy:  Electrolytes within normal limits  Plan:  Mg 1.3, IV magnesium sulfate 4 g x 1 Follow-up electrolytes with AM labs tomorrow  Elvia Collum 12/25/2021 9:24 AM

## 2021-12-25 NOTE — TOC Initial Note (Signed)
Transition of Care [facility]) - Initial/Assessment Note    Patient Details  Name: Sharon Boyd MRN: [id number] Date of Birth: 12-26-1955  Transition of Care [facility]) CM/SW Contact:    Sharon Hutching, RN Phone Number: 12/25/2021, 12:10 PM  Clinical Narrative:                 Patient admitted to the hospital with AKI, hypotension, hyperkalemia.  RNCM met with patient at the bedside and explained role.  Patient is sitting up in the bedside chair.  She is from home where she lives with her son, his wife, their kids and the daughter in laws parents.  Patient reports that she walks with a rollator, is independent in ADS's and drives.  Patient is current with her PCP but she is thinking of changing, she reports that her sister helps her with all that, she is a Marine scientist with hospice in [place].    No needs identified at this time.   Expected Discharge Plan: Home/Self Care Barriers to Discharge: Continued Medical Work up   Patient Goals and CMS Choice Patient states their goals for this hospitalization and ongoing recovery are:: To get home      Expected Discharge Plan and Services Expected Discharge Plan: Home/Self Care   Discharge Planning Services: CM Consult   Living arrangements for the past 2 months: Single Family Home                 DME Arranged: N/A DME Agency: NA       HH Arranged: NA HH Agency: NA        Prior Living Arrangements/Services Living arrangements for the past 2 months: Single Family Home Lives with:: Adult Children, Relatives Patient language and need for interpreter reviewed:: Yes Do you feel safe going back to the place where you live?: Yes      Need for Family Participation in Patient Care: Yes (Comment) Care giver support system in place?: Yes (comment) (children) Current home services: DME (rollator) Criminal Activity/Legal Involvement Pertinent to Current Situation/Hospitalization: No - Comment as needed  Activities of Daily Living       Permission Sought/Granted Permission sought to share information with : Case Manager, Family Supports Permission granted to share information with : Yes, Verbal Permission Granted  Share Information with NAME: Sharon Boyd     Permission granted to share info w Relationship: sister  Permission granted to share info w Contact Information: [phone]  Emotional Assessment Appearance:: Appears stated age Attitude/Demeanor/Rapport: Engaged Affect (typically observed): Accepting Orientation: : Oriented to Self, Oriented to Place, Oriented to  Time, Oriented to Situation Alcohol / Substance Use: Not Applicable Psych Involvement: No (comment)  Admission diagnosis:  Serum potassium elevated [E87.5] AKI (acute kidney injury) ([place]) [N17.9] Sepsis with hypotension ([place]) [A41.9, I95.9] Hypotension, unspecified hypotension type [I95.9] Patient Active Problem List   Diagnosis Date Noted   Sepsis with hypotension ([place]) 12/23/2021   PCP:  Sharon Chou, NP Pharmacy:   [facility] [id number], [place] [address] Phone: [phone] Fax: [phone]     Social Determinants of Health (SDOH) Interventions    Readmission Risk Interventions No flowsheet data found.

## 2021-12-25 NOTE — Progress Notes (Signed)
NAME:  Sharon Boyd, MRN:  [id number], DOB:  [birth year], LOS: 2 ADMISSION DATE:  12/23/2021, CONSULTATION DATE: 12/23/2021 REFERRING MD: Dorothea Glassman, MD, CHIEF COMPLAINT: Generalized weakness  HPI  66 y.o female with significant PMH of fibromyalgia, GERD, HTN, HLD, neuropathy, thyroid disease, CKD stage 3, OSA, chronic back pain, chronic neck pain, chronic pain syndrome, anxiety and depression who presented to the ED from pain clinic for evaluation of abnormal lab values.  Patient is a poor historian, history mostly obtained obtained from patient's chart and patient's sister who is currently at the bedside. Per sister, patient called her on Monday stating that she has not been able to urinate and was not feeling well following a fall from the stairs.  Patient reports that she did hit her forehead but did not lose consciousness.  Patient's sister also states she has been sleeping all week with notable slurred speech, complaints of dizziness, poor p.o. intake, dry cough and generalized malaise.  Today her pain clinic called her to report abnormal BUN and creatinine and advised her to go to the ED for further evaluation.  ED Course: In the ED, the temperature was 36.4C, the heart rate 69 beats/minute, the blood pressure 87/60 mm Hg, the respiratory rate 16 breaths/minute, and the oxygen saturation 83% on RA.chest x-ray showed diffuse patchy airspace consolidation throughout both lungs, left greater than right. Pertinent Labs in Red/Diagnostics Findings: Na+/ K+: 140/5.8 Glucose: 96 BUN/Cr.: 95/5.13   WBC: 4.8 Hgb/Hct: 9.9/31.3 PCT: pending Lactic acid: 1.1 COVID PCR: Negative   BNP: pending VBG:  pO2 53; pCO2 46; pH 7.20;  HCO3 18, %O2 Sat 78.1.  Patient given 30 cc/kg of fluids and started on broad-spectrum antibiotics Vanco cefepime and Flagyl for  suspected sepsis with septic shock. Patient remained hypotensive despite IVF boluses with bp readings  56/18 (31)  therefore was started on  Levophed. PCCM consulted.  Past Medical History  Fibromyalgia, GERD, HTN, HLD, neuropathy, thyroid disease, CKD stage 3, OSA, chronic back pain, chronic neck pain, chronic pain syndrome, anxiety and depression  Significant Hospital Events   1/15> Admitted to ICU with hypotension 1/16> Weaning Levophed, Nephrology consulted.  WBC & PCT normal, doubt sepsis, will d/c ABX for now.  Obtain ECHO 1/17> Weaned off Levophed.  Renal function greatly improved, bicarb gtt discontinued per Nephrology. Will d/c Midodrine.  Optimizing for transfer to Med-Surg unit. Echocardiogram performed, results pending. Resuming home antidepressants  Consults:  PCCM Nephrology  Procedures:  None  Significant Diagnostic Tests:  1/15: Chest Xray>Diffuse patchy airspace consolidation throughout both lungs, left greater than right. These findings may represent mixed pattern pulmonary edema or atypical pneumonia. 1/15: Noncontrast CTH/Cervical>No acute intracranial abnormality 1/15: CTA abdomen and pelvis>Exophytic lesion from the left kidney posteriorly which may represent a small cyst but incompletely evaluated on this exam. Ultrasound of the kidneys may be helpful as an initial evaluation technique when the patient's clinical symptomatology improves. No other acute abnormality is identified. 1/15: CTA Chest>Mild interstitial edema. No focal infiltrate is seen.  Micro Data:  1/15: SARS-CoV-2 PCR> negative 1/15: Influenza PCR> negative 1/15: Blood culture x2> no growth to date 1/15: Urine Culture> 1/15: MRSA PCR>> positive 1/15: Strep pneumo urinary antigen>negative 1/15: Legionella urinary antigen>  Antimicrobials:  Vancomycin 1/15>1/16 Cefepime 1/15>1/16 Metronidazole 1/15>1/16  OBJECTIVE  Blood pressure 110/63, pulse 63, temperature 97.8 F (36.6 C), resp. rate 13, height 5\' 9"  (1.753 m), weight (!) 137 kg, SpO2 100 %.    FiO2 (%):  [2 %] 2 %  Intake/Output Summary (Last 24 hours) at 12/25/2021  0839 Last data filed at 12/25/2021 [id number] Gross per 24 hour  Intake 4143.73 ml  Output 4250 ml  Net -106.27 ml    Filed Weights   12/23/21 1036 12/23/21 1437 12/25/21 0500  Weight: (!) 142 kg (!) 138.8 kg (!) 137 kg   Physical Examination  GENERAL: 66 year-old critically ill patient lying in the bed with no acute distress.  EYES: Pupils equal, round, reactive to light and accommodation. No scleral icterus. Extraocular muscles intact.  HEENT: Head atraumatic, normocephalic. Oropharynx and nasopharynx clear.  NECK:  Supple, no jugular venous distention. No thyroid enlargement, no tenderness.  LUNGS: Normal breath sounds bilaterally, no wheezing, rales,rhonchi or crepitation. No use of accessory muscles of respiration.  CARDIOVASCULAR: S1, S2 normal. No murmurs, rubs, or gallops.  ABDOMEN: Soft, nontender, nondistended. Bowel sounds present. No organomegaly or mass.  EXTREMITIES: No pedal edema, cyanosis, or clubbing.  NEUROLOGIC: Cranial nerves II through XII are intact.  Muscle strength 5/5 in all extremities. Sensation intact. Gait not checked.  PSYCHIATRIC: The patient is alert and oriented x 3.  SKIN: No obvious rash, lesion, or ulcer. vitiligo  Labs/imaging that I havepersonally reviewed  (right click and "Reselect all SmartList Selections" daily)     Labs   CBC: Recent Labs  Lab 12/23/21 1044 12/24/21 0422 12/25/21 0636  WBC 4.8   4.7 6.2 4.3  NEUTROABS 2.8  --   --   HGB 9.9*   9.9* 9.9* 9.0*  HCT 31.3*   31.4* 31.3* 27.8*  MCV 87.2   87.0 86.2 83.7  PLT 196   198 140* 134*     Basic Metabolic Panel: Recent Labs  Lab 12/23/21 1044 12/23/21 1452 12/23/21 2352 12/24/21 0422  NA 140 142  --  144  K 5.8* 6.0* 5.1 5.1  CL 114* 120*  --  117*  CO2 18* 17*  --  17*  GLUCOSE 96 105*  --  88  BUN 95* 84*  --  76*  CREATININE 5.13* 4.55*  --  3.41*  CALCIUM 8.8* 7.9*  --  8.5*  MG  --   --  1.7 1.6*  PHOS  --   --  5.6* 5.5*    GFR: Estimated Creatinine  Clearance: 25.2 mL/min (A) (by C-G formula based on SCr of 3.41 mg/dL (H)). Recent Labs  Lab 12/23/21 1044 12/23/21 1124 12/23/21 1452 12/24/21 0422 12/25/21 0636  PROCALCITON  --   --  <0.10 <0.10  --   WBC 4.8   4.7  --   --  6.2 4.3  LATICACIDVEN  --  1.1 1.4  --   --      Liver Function Tests: Recent Labs  Lab 12/23/21 1044 12/23/21 1452  AST 14* 13*  ALT 11 10  ALKPHOS 75 63  BILITOT 0.4 0.8  PROT 6.8 5.5*  ALBUMIN 3.8 3.2*    Recent Labs  Lab 12/23/21 1044  LIPASE 34    Recent Labs  Lab 12/23/21 1452  AMMONIA 30     ABG    Component Value Date/Time   HCO3 19.9 (L) 12/24/2021 0422   ACIDBASEDEF 8.0 (H) 12/24/2021 0422   O2SAT 48.0 12/24/2021 0422      Coagulation Profile: Recent Labs  Lab 12/23/21 1452  INR 1.2     Cardiac Enzymes: No results for input(s): CKTOTAL, CKMB, CKMBINDEX, TROPONINI in the last 168 hours.  HbA1C: No results found for: HGBA1C  CBG: Recent Labs  Lab  12/23/21 1429  GLUCAP 80     Review of Systems:   Positives in BOLD: Gen: Denies fever, chills, weight change, fatigue, night sweats HEENT: Denies blurred vision, double vision, hearing loss, tinnitus, sinus congestion, rhinorrhea, sore throat, neck stiffness, dysphagia PULM: Denies shortness of breath, cough, sputum production, hemoptysis, wheezing CV: Denies chest pain, edema, orthopnea, paroxysmal nocturnal dyspnea, palpitations GI: Denies abdominal pain, nausea, vomiting, diarrhea, hematochezia, melena, constipation, change in bowel habits GU: Denies dysuria, hematuria, polyuria, oliguria, urethral discharge Endocrine: Denies hot or cold intolerance, polyuria, polyphagia or appetite change Derm: Denies rash, dry skin, scaling or peeling skin change Heme: Denies easy bruising, bleeding, bleeding gums Neuro: Denies headache, numbness, weakness, slurred speech, loss of memory or consciousness   Past Medical History  She,  has a past medical history of  Arthritis, Fibromyalgia, and Hypertension.   Surgical History    Past Surgical History:  Procedure Laterality Date   ABDOMINAL HYSTERECTOMY     CHOLECYSTECTOMY       Social History   reports that she has never smoked. She has never used smokeless tobacco. She reports that she does not drink alcohol and does not use drugs.   Family History   Her family history includes Breast cancer in her mother.   Allergies No Known Allergies   Home Medications  Prior to Admission medications   Not on File  Scheduled Meds:  Chlorhexidine Gluconate Cloth  6 each Topical Daily   heparin injection (subcutaneous)  5,000 Units Subcutaneous Q8H   mouth rinse  15 mL Mouth Rinse BID   midodrine  5 mg Oral TID WC   Continuous Infusions:  sodium chloride Stopped (12/24/21 0020)   norepinephrine (LEVOPHED) Adult infusion Stopped (12/24/21 1435)   PRN Meds:.acetaminophen, docusate sodium, ondansetron (ZOFRAN) IV, polyethylene glycol  Active Hospital Problem list     Assessment & Plan:   Multifactorial Shock: Suspect Hypovolemia (in setting of several days of diarrhea) +/- Polypharmacy related hypotension (septic shock seems less likely, NO Leukocytosis) ~ RESOLVED -Continuous cardiac monitoring -Maintain MAP >65 -IV fluids (received 3L NS bolus in ED) -Vasopressors as needed to maintain MAP goal ~ currently weaned off -D/c Midodrine on 1/17 -Lactic acid normalized -HS Troponin negative x2 ( 3 ~ 3) -Random cortisol 5.4 -Echocardiogram pending -CTA Chest consistent with Mild interstitial edema. No focal infiltrate is seen.  AKI, likely prerenal in the setting of hypotension ~ IMPROVING Non-Anion Gap Metabolic Acidosis (likely due to diarrhea +multiple boluses of NS)  Hx: CKD Stage III Hyperkalemia ~ IMPROVING -Monitor I&O's / urinary output -Follow BMP -Ensure adequate renal perfusion -Avoid nephrotoxic agents as able -Replace electrolytes as indicated -Pharmacy following for  assistance with electrolyte replacement -Bicarb gtt d/c 1/17 -Renal US >> No hydronephrosis, benign appearing left renal cyst -Hold home Spironolactone, lisinopril, and HCTZ -Nephrology following, appreciate input    Acute Metabolic Encephalopathy ~ SLOWLY IMPROVING Polypharmacy? -Provide supportive care -Avoid sedating meds as able -Ammonia normal at 30 -UDS negative -CTH negative for acute intracranial abnormality  Thyroid Disorder -Normal TSH 0.716, Free T4 0.95   Chronic Pain Syndrome PMHx: Fibromyalgia, neck back and neck pain -Patient follows with pain clinic and [facility] neurosurgery -On multiple pain medications -Resume home Tizanidine & Lyrica  -Will continue to hold Oxycodone for now   Anxiety and depression -Resume home Xanax, trazodone , cymbalta  Best practice:  Diet:  Heart healthy/carb modified as tolerated Pain/Anxiety/Delirium protocol (if indicated): yes VAP protocol (if indicated): Not indicated DVT prophylaxis: Subcutaneous Heparin GI  prophylaxis: PPI Glucose control:  SSI No Central venous access:  N/A Arterial line:  N/A Foley:  no longer needed, remove 1/17 Mobility:  bed rest  PT consulted: N/A Last date of multidisciplinary goals of care discussion [1/17] Code Status:  full code Disposition: ICU   = Goals of Care = Code Status Order: FULL  Primary Emergency Contact: Jones,Sherri, Home Phone: [phone] Wishes to pursue full aggressive treatment and intervention options, including CPR and intubation, but goals of care will be addressed on going with family if that should become necessary.   Critical care time: 40 minutes    Darel Hong, AGACNP-BC [facility] Prefer epic messenger for cross cover needs If after hours, please call E-link   .

## 2021-12-26 DIAGNOSIS — I959 Hypotension, unspecified: Secondary | ICD-10-CM | POA: Diagnosis not present

## 2021-12-26 DIAGNOSIS — A419 Sepsis, unspecified organism: Secondary | ICD-10-CM | POA: Diagnosis not present

## 2021-12-26 LAB — PROTEIN ELECTRO, RANDOM URINE
Albumin ELP, Urine: 12.6 %
Alpha-1-Globulin, U: 19.2 %
Alpha-2-Globulin, U: 16.1 %
Beta Globulin, U: 39.1 %
Gamma Globulin, U: 13 %
Total Protein, Urine: 6.9 mg/dL

## 2021-12-26 LAB — BASIC METABOLIC PANEL
Anion gap: 6 (ref 5–15)
BUN: 31 mg/dL — ABNORMAL HIGH (ref 8–23)
CO2: 31 mmol/L (ref 22–32)
Calcium: 8.8 mg/dL — ABNORMAL LOW (ref 8.9–10.3)
Chloride: 106 mmol/L (ref 98–111)
Creatinine, Ser: 1.25 mg/dL — ABNORMAL HIGH (ref 0.44–1.00)
GFR, Estimated: 48 mL/min — ABNORMAL LOW (ref >60)
Glucose, Bld: 91 mg/dL (ref 70–99)
Potassium: 4.4 mmol/L (ref 3.5–5.1)
Sodium: 143 mmol/L (ref 135–145)

## 2021-12-26 LAB — MAGNESIUM: Magnesium: 1.8 mg/dL (ref 1.7–2.4)

## 2021-12-26 LAB — CBC
HCT: 27.9 % — ABNORMAL LOW (ref 36.0–46.0)
Hemoglobin: 9 g/dL — ABNORMAL LOW (ref 12.0–15.0)
MCH: 27.7 pg (ref 26.0–34.0)
MCHC: 32.3 g/dL (ref 30.0–36.0)
MCV: 85.8 fL (ref 80.0–100.0)
Platelets: 121 10*3/uL — ABNORMAL LOW (ref 150–400)
RBC: 3.25 MIL/uL — ABNORMAL LOW (ref 3.87–5.11)
RDW: 13.3 % (ref 11.5–15.5)
WBC: 4.5 10*3/uL (ref 4.0–10.5)
nRBC: 0 % (ref 0.0–0.2)

## 2021-12-26 MED ORDER — FLUTICASONE FUROATE-VILANTEROL 200-25 MCG/ACT IN AEPB
1.0000 | INHALATION_SPRAY | Freq: Every day | RESPIRATORY_TRACT | Status: DC
  Administered 2021-12-27: 1 via RESPIRATORY_TRACT
  Filled 2021-12-26: qty 28

## 2021-12-26 MED ORDER — TIZANIDINE HCL 4 MG PO TABS
4.0000 mg | ORAL_TABLET | Freq: Every day | ORAL | Status: DC
  Administered 2021-12-26 – 2021-12-27 (×2): 4 mg via ORAL
  Filled 2021-12-26 (×2): qty 1

## 2021-12-26 MED ORDER — ALBUTEROL SULFATE (2.5 MG/3ML) 0.083% IN NEBU: 3.0000 mL | INHALATION_SOLUTION | Freq: Four times a day (QID) | RESPIRATORY_TRACT | Status: DC | PRN

## 2021-12-26 NOTE — Evaluation (Signed)
Physical Therapy Evaluation Patient Details Name: Sharon Boyd MRN: [id number] DOB: 1956-09-28 Today's Date: 12/26/2021  History of Present Illness  Pt admitted for sepsis with complaints of abnormal labs. HIstory includes fibromyalgia, GERD, HTN, neuropathy, and anxiety.  Clinical Impression  Pt is a pleasant 66 year old female who was admitted for sepsis with abnormal labs. O2 and HR WNL with exertion while pt on room air. Pt demonstrates all bed mobility/transfers/ambulation at baseline level. Pt does not require any further PT needs at this time. Pt will be dc in house and does not require follow up. RN aware. Will dc current orders.      Recommendations for follow up therapy are one component of a multi-disciplinary discharge planning process, led by the attending physician.  Recommendations may be updated based on patient status, additional functional criteria and insurance authorization.  Follow Up Recommendations No PT follow up    Assistance Recommended at Discharge None  Patient can return home with the following       Equipment Recommendations None recommended by PT  Recommendations for Other Services       Functional Status Assessment Patient has not had a recent decline in their functional status     Precautions / Restrictions Restrictions Weight Bearing Restrictions: No      Mobility  Bed Mobility Overal bed mobility: Independent             General bed mobility comments: safe technique    Transfers Overall transfer level: Independent Equipment used: None               General transfer comment: indep with no assist required    Ambulation/Gait Ambulation/Gait assistance: Independent Gait Distance (Feet): 50 Feet Assistive device: None Gait Pattern/deviations: WFL(Within Functional Limits)       General Gait Details: ambulated in room with no assist required. Pt reports she has been ambulatory in room with RN permission  Stairs             Wheelchair Mobility    Modified Rankin (Stroke Patients Only)       Balance Overall balance assessment: Independent                                           Pertinent Vitals/Pain Pain Assessment Pain Assessment: No/denies pain    Home Living Family/patient expects to be discharged to:: Private residence Living Arrangements: Children Available Help at Discharge: Family;Available 24 hours/day (lives with son, DIL and grandchildren) Type of Home: House (townhome in [place]) Home Access: Level entry     Alternate Level Stairs-Number of Steps: 17 (has stair lift to access top floor) Home Layout: Two level;Bed/bath upstairs Home Equipment: Rollator (4 wheels) (stair lift to 2nd story)      Prior Function Prior Level of Function : Driving;Independent/Modified Independent             Mobility Comments: reports she uses rollator for community distances and no AD for in home distances. 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 Reports no falls     Hand Dominance        Extremity/Trunk Assessment   Upper Extremity Assessment Upper Extremity Assessment: Overall WFL for tasks assessed    Lower Extremity Assessment Lower Extremity Assessment: Overall WFL for tasks assessed       Communication   Communication: No difficulties  Cognition Arousal/Alertness: Awake/alert Behavior During Therapy: WFL for tasks assessed/performed Overall Cognitive Status: Within Functional Limits for tasks assessed                                          General Comments      Exercises     Assessment/Plan    PT Assessment Patient does not need any further PT services  PT Problem List         PT Treatment Interventions      PT Goals (Current goals can be found in the Care Plan section)  Acute Rehab PT Goals Patient Stated Goal: to go home PT Goal Formulation: All assessment and education  complete, DC therapy Time For Goal Achievement: 12/26/21 Potential to Achieve Goals: Good    Frequency       Co-evaluation               AM-PAC PT "6 Clicks" Mobility  Outcome Measure Help needed turning from your back to your side while in a flat bed without using bedrails?: None Help needed moving from lying on your back to sitting on the side of a flat bed without using bedrails?: None Help needed moving to and from a bed to a chair (including a wheelchair)?: None Help needed standing up from a chair using your arms (e.g., wheelchair or bedside chair)?: None Help needed to walk in hospital room?: None Help needed climbing 3-5 steps with a railing? : A Little 6 Click Score: 23    End of Session   Activity Tolerance: Patient tolerated treatment well Patient left: in bed (seated on EOB as she was about to ambulate to bathroom) Nurse Communication: Mobility status PT Visit Diagnosis: Difficulty in walking, not elsewhere classified (R26.2)    Time: 2956-2130 PT Time Calculation (min) (ACUTE ONLY): 18 min   Charges:   PT Evaluation $PT Eval Low Complexity: 1 Low          Greggory Stallion, PT, DPT, GCS [phone]   Edwina Grossberg 12/26/2021, 2:01 PM

## 2021-12-26 NOTE — Progress Notes (Addendum)
[facility]  ROUNDING NOTE   Subjective:   Patient resting quietly Alert and oriented Denies shortness of breath, currently on room air Denies pain and discomfort  Creatinine 1.25 Urine output 766ml on dayshift yesterday  Objective:  Vital signs in last 24 hours:  Temp:  [97.8 F (36.6 C)-98.4 F (36.9 C)] 98.2 F (36.8 C) (01/18 0746) Pulse Rate:  [64-83] 64 (01/18 0746) Resp:  [4-18] 16 (01/18 0746) BP: (95-146)/(56-89) 106/69 (01/18 0746) SpO2:  [93 %-100 %] 98 % (01/18 0746) Weight:  [139.2 kg] 139.2 kg (01/18 0500)  Weight change: 2.2 kg Filed Weights   12/23/21 1437 12/25/21 0500 12/26/21 0500  Weight: (!) 138.8 kg (!) 137 kg (!) 139.2 kg    Intake/Output: I/O last 3 completed shifts: In: 4063.7 [P.O.:120; I.V.:3943.7] Out: 2051 [Urine:2050; Stool:1]   Intake/Output this shift:  No intake/output data recorded.  Physical Exam: General: NAD  Head: Normocephalic, atraumatic. Moist oral mucosal membranes  Eyes: Anicteric  Lungs:  Clear to auscultation, normal effort  Heart: Regular rate and rhythm  Abdomen:  Soft, nontender  Extremities:  no peripheral edema.  Neurologic: Nonfocal, moving all four extremities  Skin: Erythema, abrasions bilat lower legs   Basic Metabolic Panel: Recent Labs  Lab 12/23/21 1044 12/23/21 1452 12/23/21 2352 12/24/21 0422 12/25/21 0636 12/26/21 0552  NA 140 142  --  144 144 143  K 5.8* 6.0* 5.1 5.1 4.7 4.4  CL 114* 120*  --  117* 104 106  CO2 18* 17*  --  17* 33* 31  GLUCOSE 96 105*  --  88 127* 91  BUN 95* 84*  --  76* <5* 31*  CREATININE 5.13* 4.55*  --  3.41* 1.27* 1.25*  CALCIUM 8.8* 7.9*  --  8.5* 8.5* 8.8*  MG  --   --  1.7 1.6* 1.3* 1.8  PHOS  --   --  5.6* 5.5*  --   --      Liver Function Tests: Recent Labs  Lab 12/23/21 1044 12/23/21 1452  AST 14* 13*  ALT 11 10  ALKPHOS 75 63  BILITOT 0.4 0.8  PROT 6.8 5.5*  ALBUMIN 3.8 3.2*    Recent Labs  Lab 12/23/21 1044  LIPASE 34     Recent Labs  Lab 12/23/21 1452  AMMONIA 30     CBC: Recent Labs  Lab 12/23/21 1044 12/24/21 0422 12/25/21 0636 12/26/21 0552  WBC 4.8   4.7 6.2 4.3 4.5  NEUTROABS 2.8  --   --   --   HGB 9.9*   9.9* 9.9* 9.0* 9.0*  HCT 31.3*   31.4* 31.3* 27.8* 27.9*  MCV 87.2   87.0 86.2 83.7 85.8  PLT 196   198 140* 134* 121*     Cardiac Enzymes: No results for input(s): CKTOTAL, CKMB, CKMBINDEX, TROPONINI in the last 168 hours.  BNP: Invalid input(s): POCBNP  CBG: Recent Labs  Lab 12/23/21 1429  GLUCAP 80     Microbiology: Results for orders placed or performed during the hospital encounter of 12/23/21  Resp Panel by RT-PCR (Flu A&B, Covid) Nasopharyngeal Swab     Status: None   Collection Time: 12/23/21  1:51 PM   Specimen: Nasopharyngeal Swab; Nasopharyngeal(NP) swabs in vial transport medium  Result Value Ref Range Status   SARS Coronavirus 2 by RT PCR NEGATIVE NEGATIVE Final    Comment: (NOTE) SARS-CoV-2 target nucleic acids are NOT DETECTED.  The SARS-CoV-2 RNA is generally detectable in upper respiratory specimens during the  acute phase of infection. The lowest concentration of SARS-CoV-2 viral copies this assay can detect is 138 copies/mL. A negative result does not preclude SARS-Cov-2 infection and should not be used as the sole basis for treatment or other patient management decisions. A negative result may occur with  improper specimen collection/handling, submission of specimen other than nasopharyngeal swab, presence of viral mutation(s) within the areas targeted by this assay, and inadequate number of viral copies(<138 copies/mL). A negative result must be combined with clinical observations, patient history, and epidemiological information. The expected result is Negative.  Fact Sheet for Patients:  EntrepreneurPulse.com.au  Fact Sheet for Healthcare Providers:  IncredibleEmployment.be  This test is no t yet  approved or cleared by the [place] FDA and  has been authorized for detection and/or diagnosis of SARS-CoV-2 by FDA under an Emergency Use Authorization (EUA). This EUA will remain  in effect (meaning this test can be used) for the duration of the COVID-19 declaration under Section 564(b)(1) of the Act, 21 U.S.C.section 360bbb-3(b)(1), unless the authorization is terminated  or revoked sooner.       Influenza A by PCR NEGATIVE NEGATIVE Final   Influenza B by PCR NEGATIVE NEGATIVE Final    Comment: (NOTE) The Xpert Xpress SARS-CoV-2/FLU/RSV plus assay is intended as an aid in the diagnosis of influenza from Nasopharyngeal swab specimens and should not be used as a sole basis for treatment. Nasal washings and aspirates are unacceptable for Xpert Xpress SARS-CoV-2/FLU/RSV testing.  Fact Sheet for Patients: EntrepreneurPulse.com.au  Fact Sheet for Healthcare Providers: IncredibleEmployment.be  This test is not yet approved or cleared by the [place] FDA and has been authorized for detection and/or diagnosis of SARS-CoV-2 by FDA under an Emergency Use Authorization (EUA). This EUA will remain in effect (meaning this test can be used) for the duration of the COVID-19 declaration under Section 564(b)(1) of the Act, 21 U.S.C. section 360bbb-3(b)(1), unless the authorization is terminated or revoked.  Performed at [facility], [address] [place], [place] [address]   MRSA Next Gen by PCR, Nasal     Status: Abnormal   Collection Time: 12/23/21  2:23 PM   Specimen: Urine, Catheterized; Nasal Swab  Result Value Ref Range Status   MRSA by PCR Next Gen DETECTED (A) NOT DETECTED Final    Comment: RESULT CALLED TO, READ BACK BY AND VERIFIED WITH: Terrence Dupont RN @1710  12/23/21 SCS (NOTE) The GeneXpert MRSA Assay (FDA approved for NASAL specimens only), is one component of a comprehensive MRSA colonization surveillance program.  It is not intended to diagnose MRSA infection nor to guide or monitor treatment for MRSA infections. Test performance is not FDA approved in patients less than 78 years old. Performed at [facility], [address] [place], [place] [address]   Culture, blood (x 2)     Status: None (Preliminary result)   Collection Time: 12/23/21  3:07 PM   Specimen: BLOOD LEFT HAND  Result Value Ref Range Status   Specimen Description BLOOD LEFT HAND  Final   Special Requests   Final    BOTTLES DRAWN AEROBIC AND ANAEROBIC Blood Culture adequate volume   Culture   Final    NO GROWTH 3 DAYS Performed at [facility], [address], [place], [place] [address]    Report Status PENDING  Incomplete  Culture, blood (x 2)     Status: None (Preliminary result)   Collection Time: 12/23/21  3:17 PM   Specimen: BLOOD LEFT ARM  Result Value Ref Range Status   Specimen Description BLOOD LEFT ARM  Final   Special Requests   Final    BOTTLES DRAWN AEROBIC AND ANAEROBIC Blood Culture adequate volume   Culture   Final    NO GROWTH 3 DAYS Performed at [facility], [address] [place], [place] [address]    Report Status PENDING  Incomplete  Urine Culture     Status: None   Collection Time: 12/24/21 10:09 AM   Specimen: Urine, Catheterized  Result Value Ref Range Status   Specimen Description   Final    URINE, CATHETERIZED Performed at [facility], [address] [place], [place] [address]    Special Requests   Final    NONE Performed at [facility], [address] [place], [place] [address]    Culture   Final    NO GROWTH Performed at [facility], [address] [place],  [address]    Report Status 12/25/2021 FINAL  Final    Coagulation Studies: Recent Labs    12/23/21 1452  LABPROT 15.1  INR 1.2     Urinalysis: Recent Labs    12/23/21 1038  COLORURINE YELLOW  LABSPEC 1.025  PHURINE 5.5  GLUCOSEU NEGATIVE  HGBUR  NEGATIVE  BILIRUBINUR NEGATIVE  KETONESUR NEGATIVE  PROTEINUR NEGATIVE  NITRITE NEGATIVE  LEUKOCYTESUR NEGATIVE       Imaging: US RENAL  Result Date: 12/24/2021 CLINICAL DATA:  Acute renal injury EXAM: RENAL / URINARY TRACT ULTRASOUND COMPLETE COMPARISON:  None. FINDINGS: Right Kidney: Renal measurements: 11.7 by 5.4 x 5.2 cm = volume: 165 mL. Echogenicity within normal limits. No mass or hydronephrosis visualized. Left Kidney: Renal measurements: 11.2 x 5.7 x 4.9 cm = volume: 164 mL. Echogenicity within normal limits. No mass or hydronephrosis visualized. Anechoic cysts measuring 2.4 cm in the mid RIGHT renal cortex. Bladder: Collapsed around Foley catheter. Other: None. IMPRESSION: 1. No hydronephrosis. 2. Benign appearing LEFT renal cyst. Electronically Signed   By: Suzy Bouchard M.D.   On: 12/24/2021 12:27   ECHOCARDIOGRAM COMPLETE  Result Date: 12/25/2021    ECHOCARDIOGRAM REPORT   Patient Name:   Sharon Boyd Date of Exam: 12/25/2021 Medical Rec #:  [id number]         Height:       69.0 in Accession #:    [id number]        Weight:       302.0 lb Date of Birth:  Jan 03, 1956         BSA:          2.462 m Patient Age:    76 years          BP:           114/59 mmHg Patient Gender: F                 HR:           70 bpm. Exam Location:  [facility] Procedure: 2D Echo, Cardiac Doppler and Color Doppler Indications:     Shock  History:         Patient has prior history of Echocardiogram examinations, most                  recent 03/28/2016. Risk Factors:Hypertension.  Sonographer:     Sherrie Sport Referring Phys:  [id number] Bradly Bienenstock Diagnosing Phys: Nelva Bush MD  Sonographer Comments: Suboptimal apical window and Technically challenging study due to limited  acoustic windows. IMPRESSIONS  1. Left ventricular ejection fraction, by estimation, is >55%. The left ventricle has normal function. Left ventricular endocardial border not optimally defined to evaluate regional wall motion. Left  ventricular diastolic parameters were normal.  2. Right ventricular systolic function is normal. The right ventricular size is mildly enlarged. Tricuspid regurgitation signal is inadequate for assessing PA pressure.  3. The mitral valve is grossly normal. No evidence of mitral valve regurgitation. No evidence of mitral stenosis.  4. The aortic valve was not well visualized. Aortic valve regurgitation is not visualized. No aortic stenosis is present. FINDINGS  Left Ventricle: Left ventricular ejection fraction, by estimation, is >55%. The left ventricle has normal function. Left ventricular endocardial border not optimally defined to evaluate regional wall motion. The left ventricular internal cavity size was  normal in size. There is borderline left ventricular hypertrophy. Left ventricular diastolic parameters were normal. Right Ventricle: The right ventricular size is mildly enlarged. No increase in right ventricular wall thickness. Right ventricular systolic function is normal. Tricuspid regurgitation signal is inadequate for assessing PA pressure. Left Atrium: Left atrial size was normal in size. Right Atrium: Right atrial size was normal in size. Pericardium: The pericardium was not well visualized. Mitral Valve: The mitral valve is grossly normal. No evidence of mitral valve regurgitation. No evidence of mitral valve stenosis. MV peak gradient, 3.8 mmHg. The mean mitral valve gradient is 2.0 mmHg. Tricuspid Valve: The tricuspid valve is not well visualized. Tricuspid valve regurgitation is trivial. Aortic Valve: The aortic valve was not well visualized. Aortic valve regurgitation is not visualized. No aortic stenosis is present. Aortic valve mean gradient measures 4.0 mmHg. Aortic valve peak gradient measures 6.7 mmHg. Aortic valve area, by VTI measures 3.55 cm. Pulmonic Valve: The pulmonic valve was not well visualized. Pulmonic valve regurgitation is not visualized. No evidence of pulmonic stenosis. Aorta:  The aortic root is normal in size and structure. Pulmonary Artery: The pulmonary artery is of normal size. Venous: The inferior vena cava was not well visualized. IAS/Shunts: The interatrial septum was not well visualized.  LEFT VENTRICLE PLAX 2D LVIDd:         4.20 cm   Diastology LVIDs:         3.00 cm   LV e' medial:    7.62 cm/s LV PW:         1.07 cm   LV E/e' medial:  11.3 LV IVS:        0.89 cm   LV e' lateral:   10.80 cm/s LVOT diam:     2.20 cm   LV E/e' lateral: 8.0 LV SV:         87 LV SV Index:   35 LVOT Area:     3.80 cm  RIGHT VENTRICLE RV Basal diam:  4.20 cm RV S prime:     15.90 cm/s TAPSE (M-mode): 3.0 cm LEFT ATRIUM           Index        RIGHT ATRIUM           Index LA diam:      2.70 cm 1.10 cm/m   RA Area:     10.90 cm LA Vol (A4C): 44.0 ml 17.87 ml/m  RA Volume:   23.50 ml  9.55 ml/m  AORTIC VALVE                    PULMONIC VALVE AV Area (Vmax):    3.18 cm  PV Vmax:        0.74 m/s AV Area (Vmean):   2.93 cm     PV Vmean:       50.300 cm/s AV Area (VTI):     3.55 cm     PV VTI:         0.129 m AV Vmax:           129.00 cm/s  PV Peak grad:   2.2 mmHg AV Vmean:          87.300 cm/s  PV Mean grad:   1.0 mmHg AV VTI:            0.244 m      RVOT Peak grad: 3 mmHg AV Peak Grad:      6.7 mmHg AV Mean Grad:      4.0 mmHg LVOT Vmax:         108.00 cm/s LVOT Vmean:        67.200 cm/s LVOT VTI:          0.228 m LVOT/AV VTI ratio: 0.93  AORTA Ao Root diam: 3.70 cm MITRAL VALVE MV Area (PHT): 3.51 cm    SHUNTS MV Area VTI:   3.80 cm    Systemic VTI:  0.23 m MV Peak grad:  3.8 mmHg    Systemic Diam: 2.20 cm MV Mean grad:  2.0 mmHg    Pulmonic VTI:  0.185 m MV Vmax:       0.98 m/s MV Vmean:      62.6 cm/s MV Decel Time: 216 msec MV E velocity: 86.10 cm/s MV A velocity: 77.10 cm/s MV E/A ratio:  1.12 Christopher End MD Electronically signed by Nelva Bush MD Signature Date/Time: 12/25/2021/10:38:21 AM    Final      Medications:    sodium chloride Stopped (12/24/21 0020)    celecoxib   100 mg Oral Daily   Chlorhexidine Gluconate Cloth  6 each Topical Daily   DULoxetine  60 mg Oral Daily   fluticasone furoate-vilanterol  1 puff Inhalation Daily   heparin injection (subcutaneous)  5,000 Units Subcutaneous Q8H   mouth rinse  15 mL Mouth Rinse BID   mupirocin ointment   Nasal BID   pregabalin  150 mg Oral BID   topiramate  50 mg Oral BID   traZODone  150 mg Oral QHS   acetaminophen, albuterol, ALPRAZolam, dextromethorphan-guaiFENesin, docusate sodium, ondansetron (ZOFRAN) IV, polyethylene glycol  Assessment/ Plan:  Sharon Boyd is a 66 y.o.  female with fibromyalgia, hypertension, arthritis, GERD, hyperlipidemia, hypothyroidism, sleep apnea, anxiety, depression who was admitted to [facility] on 12/23/2021 for Serum potassium elevated [E87.5] AKI (acute kidney injury) ([place]) [N17.9] Sepsis with hypotension ([place]) [A41.9, I95.9] Hypotension, unspecified hypotension type [I95.9]   Acute Kidney Injury with hyperkalemia on chronic kidney disease stage IIIA with baseline creatinine 1.16 and GFR of 59 on 08/06/19.  Lisinopril, hydrochlorothiazide and spironolactone held Renal ultrasound negative for obstruction and benign left renal cyst No indication of dialysis at this time.  -Potassium stable at 4.4 -Creatinine at baseline, adequate urine output recorded. -Continue to encourage oral intake  -Continue to avoid nephrotoxic agents. - Will schedule outpatient follow up with our office at discharge  Lab Results  Component Value Date   CREATININE 1.25 (H) 12/26/2021   CREATININE 1.27 (H) 12/25/2021   CREATININE 3.41 (H) 12/24/2021    Intake/Output Summary (Last 24 hours) at 12/26/2021 0952 Last data filed at 12/25/2021 1900 Gross per 24 hour  Intake 120  ml  Output 701 ml  Net -581 ml    2.  Metabolic acidosis with non-anion gap acidosis.  Likely due to diarrhea.  Resolved with IV sodium bicarb infusion.  3.  Hypotension due to suspected hypovolemic state.  BP  currently 106/69    LOS: 3 Chemere Steffler 1/18/20239:52 AM

## 2021-12-26 NOTE — Progress Notes (Signed)
[facility] Progress Note  Patient: Sharon Boyd    [id number]  DOA: 12/23/2021     Date of Service: the patient was seen and examined on 12/26/2021  Chief Complaint  Patient presents with   Abnormal Lab   Brief hospital course: 66 y.o female with significant PMH of fibromyalgia, GERD, HTN, HLD, neuropathy, thyroid disease, CKD stage 3, OSA, chronic back pain, chronic neck pain, chronic pain syndrome, anxiety and depression who presented to the ED from pain clinic for evaluation of abnormal lab values. Patient is a poor historian, history mostly obtained obtained from patient's chart and patient's sister who is currently at the bedside. Per sister, patient called her on Monday stating that she has not been able to urinate and was not feeling well following a fall from the stairs.  Patient reports that she did hit her forehead but did not lose consciousness.  Patient's sister also states she has been sleeping all week with notable slurred speech, complaints of dizziness, poor p.o. intake, dry cough and generalized malaise.  Today her pain clinic called her to report abnormal BUN and creatinine and advised her to go to the ED for further evaluation.   ED Course: In the ED, the temperature was 36.4C, the heart rate 69 beats/minute, the blood pressure 87/60 mm Hg, the respiratory rate 16 breaths/minute, and the oxygen saturation 83% on RA.chest x-ray showed diffuse patchy airspace consolidation throughout both lungs, left greater than right. Pertinent Labs in Red/Diagnostics Findings: Na+/ K+: 140/5.8 Glucose: 96 BUN/Cr.: 95/5.13 WBC: 4.8 Hgb/Hct: 9.9/31.3 PCT: pending Lactic acid: 1.1 COVID PCR: Negative BNP: ** VBG:  pO2 53; pCO2 46; pH 7.20;  HCO3 18, %O2 Sat 78.1.   Patient given 30 cc/kg of fluids and started on broad-spectrum antibiotics Vanco cefepime and Flagyl for  suspected sepsis with septic shock. Patient remained hypotensive despite IVF boluses with bp readings  56/18  (31)  therefore was started on Levophed. PCCM consulted.  Pressors weaned off and bicarbonate infusion was discontinued.  Patient was stabilized and downgraded under hospitalist service on 12/26/2021   Assessment and Plan: Multifactorial Shock: Suspect Hypovolemia (in setting of several days of diarrhea) +/- Polypharmacy related hypotension (septic shock seems less likely, NO Leukocytosis) ~ RESOLVED -Continuous cardiac monitoring -Maintain MAP >65 -s/p IV fluids (received 3L NS bolus in ED) -s/p Vasopressors, currently weaned off -D/c Midodrine on 1/17 -Lactic acid normalized -HS Troponin negative x2 ( 3 ~ 3) -Random cortisol 5.4 -Echocardiogram LVEF >45%,  -CTA Chest consistent with Mild interstitial edema. No focal infiltrate is seen.   AKI, likely prerenal in the setting of hypotension ~ IMPROVING Non-Anion Gap Metabolic Acidosis (likely due to diarrhea +multiple boluses of NS)  Hx: CKD Stage IIIb Hyperkalemia, resolved -Monitor I&O's / urinary output -Follow BMP -Ensure adequate renal perfusion -Avoid nephrotoxic agents as able -Replace electrolytes as indicated -Pharmacy following for assistance with electrolyte replacement -Bicarb gtt d/c 1/17 -Renal US >> No hydronephrosis, benign appearing left renal cyst -Hold home Spironolactone, lisinopril, and HCTZ -Nephrology following, appreciate input    Acute Metabolic Encephalopathy ~resolved  Polypharmacy? -Provide supportive care -Avoid sedating meds as able -Ammonia normal at 30 -UDS negative -CTH negative for acute intracranial abnormality   Thyroid Disorder -Normal TSH 0.716, Free T4 0.95    Chronic Pain Syndrome PMHx: Fibromyalgia, neck back and neck pain -Patient follows with pain clinic and [facility] -On multiple pain medications -Resume home Tizanidine & Lyrica  -Will continue to hold Oxycodone for now Discontinued celecoxib secondary  to renal failure  Anxiety and depression -Resume home Xanax,  trazodone , cymbalta  History of COPD, no exacerbation noticed on exam, resumed home inhalers   Antimicrobials:  Vancomycin 1/15>1/16 Cefepime 1/15>1/16 Metronidazole 1/15>1/16  Micro Data:  1/15: SARS-CoV-2 PCR> negative 1/15: Influenza PCR> negative 1/15: Blood culture x2> no growth to date 1/15: Urine Culture> 1/15: MRSA PCR>> positive 1/15: Strep pneumo urinary antigen>negative 1/15: Legionella urinary antigen>   Body mass index is 45.32 kg/m.  Interventions:       Diet: Heart healthy/carb modified DVT Prophylaxis: Subcutaneous Heparin    Advance goals of care discussion: Full code  Family Communication: family was not present at bedside, at the time of interview.  The pt provided permission to discuss medical plan with the family. Opportunity was given to ask question and all questions were answered satisfactorily.   Disposition:  Pt is from Home, admitted with hypotension due to sepsis, dehydration, usually improving, BP still soft, antihypertensive medications are on hold, which precludes a safe discharge. Discharge to home, when clinically stable, may require 1-2 more days.  Subjective: No significant overnight events, patient was saturating well on room air, uses oxygen 2 L at nighttime and she is on a CPAP.  Patient denied any chest pain or palpitation, no shortness of breath, no new abdominal pain, no nausea vomiting.   Physical Exam: General:  alert oriented to time, place, and person.  Appear in no distress, affect appropriate Eyes: PERRLA ENT: Oral Mucosa Clear, moist  Neck: no JVD,  Cardiovascular: S1 and S2 Present, no Murmur,  Respiratory: good respiratory effort, Bilateral Air entry equal and Decreased, no Crackles, no wheezes Abdomen: Bowel Sound present, Soft and no tenderness,  Skin: No rashes Extremities: no Pedal edema, no calf tenderness Neurologic: without any new focal findings Gait not checked due to patient safety concerns  Vitals:    12/26/21 0500 12/26/21 0501 12/26/21 0746 12/26/21 1307  BP:  (!) 95/57 106/69 122/65  Pulse:  72 64 82  Resp:  16 16 16   Temp:  98.4 F (36.9 C) 98.2 F (36.8 C)   TempSrc:  Oral Oral   SpO2:  100% 98% 100%  Weight: (!) 139.2 kg     Height:        Intake/Output Summary (Last 24 hours) at 12/26/2021 1608 Last data filed at 12/26/2021 1409 Gross per 24 hour  Intake 420 ml  Output --  Net 420 ml   Filed Weights   12/23/21 1437 12/25/21 0500 12/26/21 0500  Weight: (!) 138.8 kg (!) 137 kg (!) 139.2 kg    Data Reviewed: I have personally reviewed and interpreted daily labs, tele strips, imagings as discussed above. I reviewed all nursing notes, pharmacy notes, vitals, pertinent old records I have discussed plan of care as described above with RN and patient/family.  CBC: Recent Labs  Lab 12/23/21 1044 12/24/21 0422 12/25/21 0636 12/26/21 0552  WBC 4.8   4.7 6.2 4.3 4.5  NEUTROABS 2.8  --   --   --   HGB 9.9*   9.9* 9.9* 9.0* 9.0*  HCT 31.3*   31.4* 31.3* 27.8* 27.9*  MCV 87.2   87.0 86.2 83.7 85.8  PLT 196   198 140* 134* 123XX123*   Basic Metabolic Panel: Recent Labs  Lab 12/23/21 1044 12/23/21 1452 12/23/21 2352 12/24/21 0422 12/25/21 0636 12/26/21 0552  NA 140 142  --  144 144 143  K 5.8* 6.0* 5.1 5.1 4.7 4.4  CL 114* 120*  --  117* 104 106  CO2 18* 17*  --  17* 33* 31  GLUCOSE 96 105*  --  88 127* 91  BUN 95* 84*  --  76* <5* 31*  CREATININE 5.13* 4.55*  --  3.41* 1.27* 1.25*  CALCIUM 8.8* 7.9*  --  8.5* 8.5* 8.8*  MG  --   --  1.7 1.6* 1.3* 1.8  PHOS  --   --  5.6* 5.5*  --   --     Studies: No results found.  Scheduled Meds:  Chlorhexidine Gluconate Cloth  6 each Topical Daily   DULoxetine  60 mg Oral Daily   fluticasone furoate-vilanterol  1 puff Inhalation Daily   heparin injection (subcutaneous)  5,000 Units Subcutaneous Q8H   mouth rinse  15 mL Mouth Rinse BID   mupirocin ointment   Nasal BID   pregabalin  150 mg Oral BID   topiramate  50 mg  Oral BID   traZODone  150 mg Oral QHS   Continuous Infusions:  sodium chloride Stopped (12/24/21 0020)   PRN Meds: acetaminophen, albuterol, ALPRAZolam, dextromethorphan-guaiFENesin, docusate sodium, ondansetron (ZOFRAN) IV, polyethylene glycol  Time spent: 35 minutes  Author: Val Riles. MD [facility] 12/26/2021 4:08 PM  To reach On-call, see care teams to locate the attending and reach out to them via [email or web address]. If 7PM-7AM, please contact night-coverage If you still have difficulty reaching the attending provider, please page the [facility] (Director on Call) for [facility] on amion for assistance.

## 2021-12-26 NOTE — Progress Notes (Signed)
OT Cancellation Note  Patient Details Name: Sharon Boyd MRN: [id number] DOB: 11/28/1956   Cancelled Treatment:    Reason Eval/Treat Not Completed: OT screened, no needs identified, will sign off. Order received, chart reviewed. Per chart review and discussion with treatment team, pt appears to be back to baseline functional independence. No skilled OT needs identified. Will sign off. Please re-consult if additional needs arise.    Matthew Folks, OTR/L [facility] [phone]

## 2021-12-27 DIAGNOSIS — A419 Sepsis, unspecified organism: Secondary | ICD-10-CM | POA: Diagnosis not present

## 2021-12-27 DIAGNOSIS — I959 Hypotension, unspecified: Secondary | ICD-10-CM | POA: Diagnosis not present

## 2021-12-27 LAB — CBC
HCT: 28.5 % — ABNORMAL LOW (ref 36.0–46.0)
Hemoglobin: 9.1 g/dL — ABNORMAL LOW (ref 12.0–15.0)
MCH: 27.1 pg (ref 26.0–34.0)
MCHC: 31.9 g/dL (ref 30.0–36.0)
MCV: 84.8 fL (ref 80.0–100.0)
Platelets: 116 10*3/uL — ABNORMAL LOW (ref 150–400)
RBC: 3.36 MIL/uL — ABNORMAL LOW (ref 3.87–5.11)
RDW: 13.1 % (ref 11.5–15.5)
WBC: 5.6 10*3/uL (ref 4.0–10.5)
nRBC: 0 % (ref 0.0–0.2)

## 2021-12-27 LAB — BASIC METABOLIC PANEL
Anion gap: 4 — ABNORMAL LOW (ref 5–15)
BUN: 29 mg/dL — ABNORMAL HIGH (ref 8–23)
CO2: 29 mmol/L (ref 22–32)
Calcium: 8.9 mg/dL (ref 8.9–10.3)
Chloride: 108 mmol/L (ref 98–111)
Creatinine, Ser: 1.25 mg/dL — ABNORMAL HIGH (ref 0.44–1.00)
GFR, Estimated: 48 mL/min — ABNORMAL LOW (ref >60)
Glucose, Bld: 89 mg/dL (ref 70–99)
Potassium: 4.8 mmol/L (ref 3.5–5.1)
Sodium: 141 mmol/L (ref 135–145)

## 2021-12-27 LAB — MAGNESIUM: Magnesium: 1.5 mg/dL — ABNORMAL LOW (ref 1.7–2.4)

## 2021-12-27 LAB — PHOSPHORUS: Phosphorus: 2.5 mg/dL (ref 2.5–4.6)

## 2021-12-27 MED ORDER — MAGNESIUM SULFATE 2 GM/50ML IV SOLN
2.0000 g | Freq: Once | INTRAVENOUS | Status: AC
  Administered 2021-12-27: 11:00:00 2 g via INTRAVENOUS
  Filled 2021-12-27: qty 50

## 2021-12-27 MED ORDER — METOPROLOL TARTRATE 25 MG PO TABS: 12.5000 mg | ORAL_TABLET | Freq: Two times a day (BID) | ORAL | 0 refills | Status: AC | PRN

## 2021-12-27 MED ORDER — FAMOTIDINE 20 MG PO TABS: 20.0000 mg | ORAL_TABLET | Freq: Two times a day (BID) | ORAL | 1 refills | Status: AC | PRN

## 2021-12-27 NOTE — Discharge Summary (Signed)
[facility] Discharge Summary   Patient: Sharon Boyd FBP:[id number]  PCP: Alvester Chou, NP  Date of admission: 12/23/2021   Date of discharge:  12/27/2021     Discharge Diagnoses:  Principal Problem:   Sepsis with hypotension [facility])   Admitted From: Home Disposition:  Home   Recommendations for Outpatient Follow-up:  PCP: in 1 wk  Follow up LABS/TEST:  BMP in 1 wk Follow-up with PCP in 1 week, continue to monitor BP at home and follow with PCP to titrate medication accordingly.  Started metoprolol 12.5 mg p.o. twice daily as needed if systolic BP greater than 824 mmHg.  Discontinued lisinopril, hydrochlorothiazide, spironolactone. Discontinued PPI secondary to hypomagnesemia, started on Pepcid. Repeat CBC, BMP and magnesium level after 1 week. Patient is still on a lot of medication, may be having some side effects due to polypharmacy, consider decreasing medications.  Diet recommendation: Cardiac diet  Activity: The patient is advised to gradually reintroduce usual activities, as tolerated  Discharge Condition: stable  Code Status: Full code   History of present illness: As per the H and P dictated on admission Hospital Course:  66 y.o female with significant PMH of fibromyalgia, GERD, HTN, HLD, neuropathy, thyroid disease, CKD stage 3, OSA, chronic back pain, chronic neck pain, chronic pain syndrome, anxiety and depression who presented to the ED from pain clinic for evaluation of abnormal lab values. Patient is a poor historian, history mostly obtained obtained from patient's chart and patient's sister who is currently at the bedside. Per sister, patient called her on Monday stating that she has not been able to urinate and was not feeling well following a fall from the stairs.  Patient reports that she did hit her forehead but did not lose consciousness.  Patient's sister also states she has been sleeping all week with notable slurred speech, complaints of dizziness, poor  p.o. intake, dry cough and generalized malaise.  Today her pain clinic called her to report abnormal BUN and creatinine and advised her to go to the ED for further evaluation. ED Course: In the ED, the temperature was 36.4C, the heart rate 69 beats/minute, the blood pressure 87/60 mm Hg, the respiratory rate 16 breaths/minute, and the oxygen saturation 83% on RA.chest x-ray showed diffuse patchy airspace consolidation throughout both lungs, left greater than right. Pertinent Labs in Red/Diagnostics Findings: Na+/ K+: 140/5.8, Glucose: 96, BUN/Cr.: 95/5.13, WBC: 4.8, Hgb/Hct: 9.9/31.3 Lactic acid: 1.1, COVID PCR: Negative, BNP: 174, troponin negative VBG:  pO2 53; pCO2 46; pH 7.20;  HCO3 18, %O2 Sat 78.1. Patient given 30 cc/kg of fluids and started on broad-spectrum antibiotics Vanco cefepime and Flagyl for  suspected sepsis with septic shock. Patient remained hypotensive despite IVF boluses with bp readings  56/18 (31)  therefore was started on Levophed. PCCM consulted. Pressors weaned off and bicarbonate infusion was discontinued.  Patient was stabilized and downgraded under hospitalist service on 12/26/2021  Assessment and Plan: Multifactorial Shock: Suspect Hypovolemia (in setting of several days of diarrhea) +/- Polypharmacy related hypotension (septic shock seems less likely, NO Leukocytosis) ~ RESOLVED, s/p cardiac monitoring, MAP >65, s/p IV fluids (received 3L NS bolus in ED) s/p Vasopressors, currently weaned off, D/c Midodrine on 1/17 and Lactic acid normalizedTroponin negative x2 ( 3 ~ 3), -Random cortisol 5.4, TTE LVEF >45%,  CTA Chest consistent with Mild interstitial edema. No focal infiltrate is seen.  AKI, likely prerenal in the setting of hypotension ~ IMPROVING Non-Anion Gap Metabolic Acidosis (likely due to diarrhea +multiple boluses of  NS)  Hx: CKD Stage IIIb and Hyperkalemia, resolved. S/p IVF and Bicarb gtt d/c 1/17 Renal US >> No hydronephrosis, benign appearing left renal  cyst, discontinued  Spironolactone, lisinopril, and HCTZ home medications.  Nephrology consult appreciated. Creatinine 1.25, improved.  Follow with PCP and repeat BMP after 1 week. Acute Metabolic Encephalopathy ~resolved  Polypharmacy? Ammonia normal at 30, UDS negative. CTH negative for acute intracranial abnormality Thyroid Disorder, Normal TSH 0.716, Free T4 0.95  Chronic Pain Syndrome and PMHx: Fibromyalgia, neck back and neck pain Patient follows with pain clinic and [facility] neurosurgery, On multiple pain medications Resume home Tizanidine & Lyrica, continued Oxycodone on discharge, discontinued celecoxib secondary to renal failure Anxiety and depression, Resume home Xanax, trazodone , cymbalta History of COPD, no exacerbation noticed on exam, resumed home inhalers Antimicrobials:  Vancomycin 1/15>1/16 Cefepime 1/15>1/16 Metronidazole 1/15>1/16   Micro Data:  1/15: SARS-CoV-2 PCR> negative 1/15: Influenza PCR> negative 1/15: Blood culture x2> no growth to date 1/15: Urine Culture> 1/15: MRSA PCR>> positive 1/15: Strep pneumo urinary antigen>negative 1/15: Legionella urinary antigen> Body mass index is 45.32 kg/m.  Nutrition Interventions:    Patient was seen by physical therapy, who recommended no therapy needed on discharge, On the day of the discharge the patient's vitals were stable, and no other acute medical condition were reported by patient. the patient was felt safe to be discharge at Home.  Consultants: PCCM and nephrology Procedures: None  Discharge Exam: General: Appear in no distress, no Rash; Oral Mucosa Clear, moist. Cardiovascular: S1 and S2 Present, no Murmur, Respiratory: normal respiratory effort, Bilateral Air entry present and no Crackles, no wheezes Abdomen: Bowel Sound present, Soft and no tenderness, no hernia Extremities: no Pedal edema, no calf tenderness Neurology: alert and oriented to time, place, and person affect appropriate.  Filed Weights    12/23/21 1437 12/25/21 0500 12/26/21 0500  Weight: (!) 138.8 kg (!) 137 kg (!) 139.2 kg   Vitals:   12/27/21 0751 12/27/21 1211  BP: 117/72 117/66  Pulse: 64 74  Resp:  18  Temp:  98.1 F (36.7 C)  SpO2: 97% 96%    DISCHARGE MEDICATION: Allergies as of 12/27/2021   No Known Allergies      Medication List     STOP taking these medications    buprenorphine 5 MCG/HR Ptwk Commonly known as: BUTRANS   celecoxib 100 MG capsule Commonly known as: CELEBREX   lisinopril-hydrochlorothiazide 20-25 MG tablet Commonly known as: ZESTORETIC   pantoprazole 40 MG tablet Commonly known as: PROTONIX   rizatriptan 10 MG tablet Commonly known as: MAXALT   spironolactone 50 MG tablet Commonly known as: ALDACTONE       TAKE these medications    albuterol 108 (90 Base) MCG/ACT inhaler Commonly known as: VENTOLIN HFA Inhale 2 puffs into the lungs every 6 (six) hours as needed.   ALPRAZolam 0.5 MG tablet Commonly known as: XANAX Take 1 tablet by mouth 2 (two) times daily as needed.   DULoxetine 60 MG capsule Commonly known as: CYMBALTA Take 60 mg by mouth daily.   ergocalciferol 1.25 MG (50000 UT) capsule Commonly known as: VITAMIN D2 Take 1 capsule by mouth in the morning and at bedtime. Wednesday and saturday   famotidine 20 MG tablet Commonly known as: PEPCID Take 1 tablet (20 mg total) by mouth 2 (two) times daily as needed for heartburn or indigestion.   fluticasone 50 MCG/ACT nasal spray Commonly known as: FLONASE Place 1 spray into both nostrils daily.   metoprolol  tartrate 25 MG tablet Commonly known as: LOPRESSOR Take 0.5 tablets (12.5 mg total) by mouth 2 (two) times daily as needed (if systolic BP greater than 322 mmHg). What changed:  medication strength how much to take when to take this reasons to take this   Multi-Vitamin tablet Take 1 tablet by mouth daily.   naloxone 4 MG/0.1ML Liqd nasal spray kit Commonly known as: NARCAN Place into the  nose as directed.   oxyCODONE-acetaminophen 10-325 MG tablet Commonly known as: PERCOCET Take 1 tablet by mouth 4 (four) times daily as needed.   pregabalin 150 MG capsule Commonly known as: LYRICA Take 150 mg by mouth 2 (two) times daily.   simvastatin 40 MG tablet Commonly known as: ZOCOR Take 1 tablet by mouth daily.   Symbicort 160-4.5 MCG/ACT inhaler Generic drug: budesonide-formoterol Inhale 1 puff into the lungs daily.   tiZANidine 4 MG tablet Commonly known as: ZANAFLEX Take 1 tablet by mouth daily.   topiramate 50 MG tablet Commonly known as: TOPAMAX Take 1 tablet by mouth in the morning and at bedtime.   traZODone 150 MG tablet Commonly known as: DESYREL Take 1 tablet by mouth at bedtime.       No Known Allergies Discharge Instructions     Call MD for:  difficulty breathing, headache or visual disturbances   Complete by: As directed    Call MD for:  extreme fatigue   Complete by: As directed    Call MD for:  persistant dizziness or light-headedness   Complete by: As directed    Call MD for:  persistant nausea and vomiting   Complete by: As directed    Call MD for:  severe uncontrolled pain   Complete by: As directed    Call MD for:  temperature >100.4   Complete by: As directed    Diet - low sodium heart healthy   Complete by: As directed    Discharge instructions   Complete by: As directed    Follow-up with PCP in 1 week, continue to monitor BP at home and follow with PCP to titrate medication accordingly.  Started metoprolol 12.5 mg p.o. twice daily as needed if systolic BP greater than 025 mmHg.  Discontinued lisinopril, hydrochlorothiazide, spironolactone. Discontinued PPI secondary to hypomagnesemia, started on Pepcid. Repeat CBC, BMP and magnesium level after 1 week. Patient is still on a lot of medication, may be having some side effects due to polypharmacy, consider decreasing medications.   Increase activity slowly   Complete by: As  directed        The results of significant diagnostics from this hospitalization (including imaging, microbiology, ancillary and laboratory) are listed below for reference.    Significant Diagnostic Studies: CT HEAD WO CONTRAST (5MM)  Result Date: 12/23/2021 CLINICAL DATA:  Abnormal intl status EXAM: CT HEAD WITHOUT CONTRAST TECHNIQUE: Contiguous axial images were obtained from the base of the skull through the vertex without intravenous contrast. RADIATION DOSE REDUCTION: This exam was performed according to the departmental dose-optimization program which includes automated exposure control, adjustment of the mA and/or kV according to patient size and/or use of iterative reconstruction technique. COMPARISON:  08/06/2019 FINDINGS: Brain: There is no acute intracranial hemorrhage, mass effect, or edema. Gray-white differentiation is preserved. There is no extra-axial fluid collection. Ventricles and sulci are within normal limits in size and configuration. Vascular: No hyperdense vessel or unexpected calcification. Skull: Calvarium is unremarkable. Sinuses/Orbits: No acute finding. Other: Scattered small foci of air in the soft tissues of the  included face are probably intravenous. Mastoid air cells are clear. IMPRESSION: No acute intracranial abnormality. Electronically Signed   By: Macy Mis M.D.   On: 12/23/2021 15:19   CT CERVICAL SPINE WO CONTRAST  Result Date: 12/23/2021 CLINICAL DATA:  Sepsis, neck trauma, hypertension EXAM: CT CERVICAL SPINE WITHOUT CONTRAST TECHNIQUE: Multidetector CT imaging of the cervical spine was performed without intravenous contrast. Multiplanar CT image reconstructions were also generated. RADIATION DOSE REDUCTION: This exam was performed according to the departmental dose-optimization program which includes automated exposure control, adjustment of the mA and/or kV according to patient size and/or use of iterative reconstruction technique. COMPARISON:   08/06/2019 FINDINGS: Alignment: Alignment is anatomic. Skull base and vertebrae: No acute fracture. No primary bone lesion or focal pathologic process. Soft tissues and spinal canal: No prevertebral fluid or swelling. No visible canal hematoma. Disc levels: Mild diffuse facet hypertrophy greatest on the left at C4-5, C5-6, and C6-7. Disc spaces are relatively well preserved. Upper chest: Airway is patent. Visualized portions of the lung apices are clear. Other: Reconstructed images demonstrate no additional findings. IMPRESSION: 1. No acute cervical spine fracture. Electronically Signed   By: Randa Ngo M.D.   On: 12/23/2021 15:23   US RENAL  Result Date: 12/24/2021 CLINICAL DATA:  Acute renal injury EXAM: RENAL / URINARY TRACT ULTRASOUND COMPLETE COMPARISON:  None. FINDINGS: Right Kidney: Renal measurements: 11.7 by 5.4 x 5.2 cm = volume: 165 mL. Echogenicity within normal limits. No mass or hydronephrosis visualized. Left Kidney: Renal measurements: 11.2 x 5.7 x 4.9 cm = volume: 164 mL. Echogenicity within normal limits. No mass or hydronephrosis visualized. Anechoic cysts measuring 2.4 cm in the mid RIGHT renal cortex. Bladder: Collapsed around Foley catheter. Other: None. IMPRESSION: 1. No hydronephrosis. 2. Benign appearing LEFT renal cyst. Electronically Signed   By: Suzy Bouchard M.D.   On: 12/24/2021 12:27   DG Chest Portable 1 View  Result Date: 12/23/2021 CLINICAL DATA:  Hypertension. EXAM: PORTABLE CHEST 1 VIEW COMPARISON:  March 15, 2008 FINDINGS: Cardiomediastinal silhouette is normal. Mediastinal contours appear intact. Diffuse patchy airspace consolidation throughout both lungs, left greater than right. Low lung volumes. Osseous structures are without acute abnormality. Soft tissues are grossly normal. IMPRESSION: Diffuse patchy airspace consolidation throughout both lungs, left greater than right. These findings may represent mixed pattern pulmonary edema or atypical pneumonia.  Electronically Signed   By: Fidela Salisbury M.D.   On: 12/23/2021 12:40   ECHOCARDIOGRAM COMPLETE  Result Date: 12/25/2021    ECHOCARDIOGRAM REPORT   Patient Name:   SHAWNAY BRAMEL Date of Exam: 12/25/2021 Medical Rec #:  [id number]         Height:       69.0 in Accession #:    [id number]        Weight:       302.0 lb Date of Birth:  December 26, 1955         BSA:          2.462 m Patient Age:    19 years          BP:           114/59 mmHg Patient Gender: F                 HR:           70 bpm. Exam Location:  [facility] Procedure: 2D Echo, Cardiac Doppler and Color Doppler Indications:     Shock  History:  Patient has prior history of Echocardiogram examinations, most                  recent 03/28/2016. Risk Factors:Hypertension.  Sonographer:     Sherrie Sport Referring Phys:  [id number] Bradly Bienenstock Diagnosing Phys: Nelva Bush MD  Sonographer Comments: Suboptimal apical window and Technically challenging study due to limited acoustic windows. IMPRESSIONS  1. Left ventricular ejection fraction, by estimation, is >55%. The left ventricle has normal function. Left ventricular endocardial border not optimally defined to evaluate regional wall motion. Left ventricular diastolic parameters were normal.  2. Right ventricular systolic function is normal. The right ventricular size is mildly enlarged. Tricuspid regurgitation signal is inadequate for assessing PA pressure.  3. The mitral valve is grossly normal. No evidence of mitral valve regurgitation. No evidence of mitral stenosis.  4. The aortic valve was not well visualized. Aortic valve regurgitation is not visualized. No aortic stenosis is present. FINDINGS  Left Ventricle: Left ventricular ejection fraction, by estimation, is >55%. The left ventricle has normal function. Left ventricular endocardial border not optimally defined to evaluate regional wall motion. The left ventricular internal cavity size was  normal in size. There is borderline left ventricular  hypertrophy. Left ventricular diastolic parameters were normal. Right Ventricle: The right ventricular size is mildly enlarged. No increase in right ventricular wall thickness. Right ventricular systolic function is normal. Tricuspid regurgitation signal is inadequate for assessing PA pressure. Left Atrium: Left atrial size was normal in size. Right Atrium: Right atrial size was normal in size. Pericardium: The pericardium was not well visualized. Mitral Valve: The mitral valve is grossly normal. No evidence of mitral valve regurgitation. No evidence of mitral valve stenosis. MV peak gradient, 3.8 mmHg. The mean mitral valve gradient is 2.0 mmHg. Tricuspid Valve: The tricuspid valve is not well visualized. Tricuspid valve regurgitation is trivial. Aortic Valve: The aortic valve was not well visualized. Aortic valve regurgitation is not visualized. No aortic stenosis is present. Aortic valve mean gradient measures 4.0 mmHg. Aortic valve peak gradient measures 6.7 mmHg. Aortic valve area, by VTI measures 3.55 cm. Pulmonic Valve: The pulmonic valve was not well visualized. Pulmonic valve regurgitation is not visualized. No evidence of pulmonic stenosis. Aorta: The aortic root is normal in size and structure. Pulmonary Artery: The pulmonary artery is of normal size. Venous: The inferior vena cava was not well visualized. IAS/Shunts: The interatrial septum was not well visualized.  LEFT VENTRICLE PLAX 2D LVIDd:         4.20 cm   Diastology LVIDs:         3.00 cm   LV e' medial:    7.62 cm/s LV PW:         1.07 cm   LV E/e' medial:  11.3 LV IVS:        0.89 cm   LV e' lateral:   10.80 cm/s LVOT diam:     2.20 cm   LV E/e' lateral: 8.0 LV SV:         87 LV SV Index:   35 LVOT Area:     3.80 cm  RIGHT VENTRICLE RV Basal diam:  4.20 cm RV S prime:     15.90 cm/s TAPSE (M-mode): 3.0 cm LEFT ATRIUM           Index        RIGHT ATRIUM           Index LA diam:      2.70 cm 1.10  cm/m   RA Area:     10.90 cm LA Vol (A4C): 44.0  ml 17.87 ml/m  RA Volume:   23.50 ml  9.55 ml/m  AORTIC VALVE                    PULMONIC VALVE AV Area (Vmax):    3.18 cm     PV Vmax:        0.74 m/s AV Area (Vmean):   2.93 cm     PV Vmean:       50.300 cm/s AV Area (VTI):     3.55 cm     PV VTI:         0.129 m AV Vmax:           129.00 cm/s  PV Peak grad:   2.2 mmHg AV Vmean:          87.300 cm/s  PV Mean grad:   1.0 mmHg AV VTI:            0.244 m      RVOT Peak grad: 3 mmHg AV Peak Grad:      6.7 mmHg AV Mean Grad:      4.0 mmHg LVOT Vmax:         108.00 cm/s LVOT Vmean:        67.200 cm/s LVOT VTI:          0.228 m LVOT/AV VTI ratio: 0.93  AORTA Ao Root diam: 3.70 cm MITRAL VALVE MV Area (PHT): 3.51 cm    SHUNTS MV Area VTI:   3.80 cm    Systemic VTI:  0.23 m MV Peak grad:  3.8 mmHg    Systemic Diam: 2.20 cm MV Mean grad:  2.0 mmHg    Pulmonic VTI:  0.185 m MV Vmax:       0.98 m/s MV Vmean:      62.6 cm/s MV Decel Time: 216 msec MV E velocity: 86.10 cm/s MV A velocity: 77.10 cm/s MV E/A ratio:  1.12 Harrell Gave End MD Electronically signed by Nelva Bush MD Signature Date/Time: 12/25/2021/10:38:21 AM    Final    CT CHEST ABDOMEN PELVIS WO CONTRAST  Result Date: 12/23/2021 CLINICAL DATA:  Possible sepsis EXAM: CT CHEST, ABDOMEN AND PELVIS WITHOUT CONTRAST TECHNIQUE: Multidetector CT imaging of the chest, abdomen and pelvis was performed following the standard protocol without IV contrast. RADIATION DOSE REDUCTION: This exam was performed according to the departmental dose-optimization program which includes automated exposure control, adjustment of the mA and/or kV according to patient size and/or use of iterative reconstruction technique. COMPARISON:  Chest x-ray from earlier in the same day. FINDINGS: CT CHEST FINDINGS Cardiovascular: Somewhat limited due to lack of IV contrast. Aortic calcifications are noted without aneurysmal dilatation or dissection. No cardiac enlargement is noted. Coronary calcifications are seen. No significant  enlargement of the pulmonary artery is noted. Mediastinum/Nodes: Thoracic inlet is within normal limits. No sizable hilar or mediastinal adenopathy is noted. The esophagus as visualized is within normal limits. Lungs/Pleura: Lungs are well aerated bilaterally without focal infiltrate or sizable effusion. Interstitial thickening is noted consistent with mild edema. These changes are similar to that seen on prior plain film examination. No pneumothorax is noted. Musculoskeletal: Degenerative changes of the thoracic spine are seen. No acute rib abnormality is noted. CT ABDOMEN PELVIS FINDINGS Hepatobiliary: No focal liver abnormality is seen. Status post cholecystectomy. No biliary dilatation. Pancreas: Unremarkable. No pancreatic ductal dilatation or surrounding inflammatory changes. Spleen: Normal in  size without focal abnormality. Adrenals/Urinary Tract: Adrenal glands are within normal limits. Kidneys are well visualized bilaterally. No renal calculi or obstructive changes are seen. Exophytic lesion is noted from the posterior aspect of the left kidney. May represent a small cyst but is incompletely characterized on this exam. No ureteral obstructive changes are seen. The bladder is partially distended. Stomach/Bowel: The appendix is not well visualized and may have been surgically removed. No obstructive or inflammatory changes of the colon are seen. Stomach is within normal limits. No focal abnormality in the small bowel is seen. Vascular/Lymphatic: Aortic atherosclerosis. No enlarged abdominal or pelvic lymph nodes. Reproductive: Status post hysterectomy. No adnexal masses. Other: No abdominal wall hernia or abnormality. No abdominopelvic ascites. Musculoskeletal: Mild degenerative changes of lumbar spine are noted. No acute bony abnormality is seen. IMPRESSION: CT of the chest: Mild interstitial edema. No focal infiltrate is seen. CT of the abdomen and pelvis: Exophytic lesion from the left kidney posteriorly  which may represent a small cyst but incompletely evaluated on this exam. Ultrasound of the kidneys may be helpful as an initial evaluation technique when the patient's clinical symptomatology improves. No other acute abnormality is identified. Electronically Signed   By: Inez Catalina M.D.   On: 12/23/2021 19:43    Microbiology: Recent Results (from the past 240 hour(s))  Resp Panel by RT-PCR (Flu A&B, Covid) Nasopharyngeal Swab     Status: None   Collection Time: 12/23/21  1:51 PM   Specimen: Nasopharyngeal Swab; Nasopharyngeal(NP) swabs in vial transport medium  Result Value Ref Range Status   SARS Coronavirus 2 by RT PCR NEGATIVE NEGATIVE Final    Comment: (NOTE) SARS-CoV-2 target nucleic acids are NOT DETECTED.  The SARS-CoV-2 RNA is generally detectable in upper respiratory specimens during the acute phase of infection. The lowest concentration of SARS-CoV-2 viral copies this assay can detect is 138 copies/mL. A negative result does not preclude SARS-Cov-2 infection and should not be used as the sole basis for treatment or other patient management decisions. A negative result may occur with  improper specimen collection/handling, submission of specimen other than nasopharyngeal swab, presence of viral mutation(s) within the areas targeted by this assay, and inadequate number of viral copies(<138 copies/mL). A negative result must be combined with clinical observations, patient history, and epidemiological information. The expected result is Negative.  Fact Sheet for Patients:  EntrepreneurPulse.com.au  Fact Sheet for Healthcare Providers:  IncredibleEmployment.be  This test is no t yet approved or cleared by the [place] FDA and  has been authorized for detection and/or diagnosis of SARS-CoV-2 by FDA under an Emergency Use Authorization (EUA). This EUA will remain  in effect (meaning this test can be used) for the duration of  the COVID-19 declaration under Section 564(b)(1) of the Act, 21 U.S.C.section 360bbb-3(b)(1), unless the authorization is terminated  or revoked sooner.       Influenza A by PCR NEGATIVE NEGATIVE Final   Influenza B by PCR NEGATIVE NEGATIVE Final    Comment: (NOTE) The Xpert Xpress SARS-CoV-2/FLU/RSV plus assay is intended as an aid in the diagnosis of influenza from Nasopharyngeal swab specimens and should not be used as a sole basis for treatment. Nasal washings and aspirates are unacceptable for Xpert Xpress SARS-CoV-2/FLU/RSV testing.  Fact Sheet for Patients: EntrepreneurPulse.com.au  Fact Sheet for Healthcare Providers: IncredibleEmployment.be  This test is not yet approved or cleared by the [place] FDA and has been authorized for detection and/or diagnosis of SARS-CoV-2 by FDA under an Emergency Use  Authorization (EUA). This EUA will remain in effect (meaning this test can be used) for the duration of the COVID-19 declaration under Section 564(b)(1) of the Act, 21 U.S.C. section 360bbb-3(b)(1), unless the authorization is terminated or revoked.  Performed at [facility], [address] [place], [place] [address]   MRSA Next Gen by PCR, Nasal     Status: Abnormal   Collection Time: 12/23/21  2:23 PM   Specimen: Urine, Catheterized; Nasal Swab  Result Value Ref Range Status   MRSA by PCR Next Gen DETECTED (A) NOT DETECTED Final    Comment: RESULT CALLED TO, READ BACK BY AND VERIFIED WITH: Terrence Dupont RN _0  12/23/21 SCS (NOTE) The GeneXpert MRSA Assay (FDA approved for NASAL specimens only), is one component of a comprehensive MRSA colonization surveillance program. It is not intended to diagnose MRSA infection nor to guide or monitor treatment for MRSA infections. Test performance is not FDA approved in patients less than 54 years old. Performed at [facility], [address] [place], [place]  [address]   Culture, blood (x 2)     Status: None (Preliminary result)   Collection Time: 12/23/21  3:07 PM   Specimen: BLOOD LEFT HAND  Result Value Ref Range Status   Specimen Description BLOOD LEFT HAND  Final   Special Requests   Final    BOTTLES DRAWN AEROBIC AND ANAEROBIC Blood Culture adequate volume   Culture   Final    NO GROWTH 4 DAYS Performed at [facility], [address] [place], [place] [address]    Report Status PENDING  Incomplete  Culture, blood (x 2)     Status: None (Preliminary result)   Collection Time: 12/23/21  3:17 PM   Specimen: BLOOD LEFT ARM  Result Value Ref Range Status   Specimen Description BLOOD LEFT ARM  Final   Special Requests   Final    BOTTLES DRAWN AEROBIC AND ANAEROBIC Blood Culture adequate volume   Culture   Final    NO GROWTH 4 DAYS Performed at [facility], [address], [place], [place] [address]    Report Status PENDING  Incomplete  Urine Culture     Status: None   Collection Time: 12/24/21 10:09 AM   Specimen: Urine, Catheterized  Result Value Ref Range Status   Specimen Description   Final    URINE, CATHETERIZED Performed at [facility], [address] [place], [place] [address]    Special Requests   Final    NONE Performed at [facility], [address], [place], [place] [address]    Culture   Final    NO GROWTH Performed at [facility], [place] [address], [place], [place] [address]    Report Status 12/25/2021 FINAL  Final     Labs: CBC: Recent Labs  Lab 12/23/21 1044 12/24/21 0422 12/25/21 0636 12/26/21 0552 12/27/21 0442  WBC 4.8   4.7 6.2 4.3 4.5 5.6  NEUTROABS 2.8  --   --   --   --   HGB 9.9*   9.9* 9.9* 9.0* 9.0* 9.1*  HCT 31.3*   31.4* 31.3* 27.8* 27.9* 28.5*  MCV 87.2   87.0 86.2 83.7 85.8 84.8  PLT 196   198 140* 134* 121* 831*   Basic Metabolic Panel: Recent Labs  Lab 12/23/21 1452 12/23/21 2352 12/24/21 0422 12/25/21 0636 12/26/21 0552  12/27/21 0442  NA 142  --  144 144 143 141  K 6.0* 5.1  5.1 4.7 4.4 4.8  CL 120*  --  117* 104 106 108  CO2 17*  --  17* 33* 31 29  GLUCOSE 105*  --  88 127* 91 89  BUN 84*  --  76* <5* 31* 29*  CREATININE 4.55*  --  3.41* 1.27* 1.25* 1.25*  CALCIUM 7.9*  --  8.5* 8.5* 8.8* 8.9  MG  --  1.7 1.6* 1.3* 1.8 1.5*  PHOS  --  5.6* 5.5*  --   --  2.5   Liver Function Tests: Recent Labs  Lab 12/23/21 1044 12/23/21 1452  AST 14* 13*  ALT 11 10  ALKPHOS 75 63  BILITOT 0.4 0.8  PROT 6.8 5.5*  ALBUMIN 3.8 3.2*   Recent Labs  Lab 12/23/21 1044  LIPASE 34   Recent Labs  Lab 12/23/21 1452  AMMONIA 30   Cardiac Enzymes: No results for input(s): CKTOTAL, CKMB, CKMBINDEX, TROPONINI in the last 168 hours. BNP (last 3 results) Recent Labs    12/23/21 1452  BNP 174.4*   CBG: Recent Labs  Lab 12/23/21 1429  GLUCAP 80    Time spent: 35 minutes  Signed:  Val Riles  [facility]  12/27/2021 12:45 PM

## 2021-12-27 NOTE — Care Management Important Message (Signed)
Important Message  Patient Details  Name: Sharon Boyd MRN: [id number] Date of Birth: 1956/06/05   Medicare Important Message Given:  Yes     Olegario Messier [name] 12/27/2021, 1:02 PM

## 2021-12-27 NOTE — TOC Progression Note (Signed)
Transition of Care [facility]) - Progression Note    Patient Details  Name: Sharon Boyd MRN: [id number] Date of Birth: [birth year]  Transition of Care [facility]) CM/SW Barrett, RN Phone Number: 12/27/2021, 1:51 PM  Clinical Narrative:   Patient lives with family, no transportation or medication concerns.  She is able to take medications as directed.  She walks with a rollator at home, states she will not need any equipment or therapy for follow up.  She is up to date with her PCP and specialist visits.  She has no concerns about discharging home today.    Expected Discharge Plan: Home/Self Care Barriers to Discharge: Continued Medical Work up  Expected Discharge Plan and Services Expected Discharge Plan: Home/Self Care   Discharge Planning Services: CM Consult   Living arrangements for the past 2 months: Single Family Home Expected Discharge Date: 12/27/21               DME Arranged: N/A DME Agency: NA       HH Arranged: NA HH Agency: NA         Social Determinants of Health (SDOH) Interventions    Readmission Risk Interventions No flowsheet data found.

## 2021-12-28 LAB — CULTURE, BLOOD (ROUTINE X 2)
Culture: NO GROWTH
Culture: NO GROWTH
Special Requests: ADEQUATE
Special Requests: ADEQUATE

## 2022-04-18 ENCOUNTER — Other Ambulatory Visit: Payer: Self-pay | Admitting: Adult Health

## 2022-04-18 DIAGNOSIS — R2231 Localized swelling, mass and lump, right upper limb: Secondary | ICD-10-CM

## 2022-05-02 ENCOUNTER — Other Ambulatory Visit: Payer: Self-pay | Admitting: Adult Health

## 2022-05-02 DIAGNOSIS — R2231 Localized swelling, mass and lump, right upper limb: Secondary | ICD-10-CM

## 2022-05-02 DIAGNOSIS — R2232 Localized swelling, mass and lump, left upper limb: Secondary | ICD-10-CM

## 2022-05-15 ENCOUNTER — Ambulatory Visit
Admission: RE | Admit: 2022-05-15 | Discharge: 2022-05-15 | Disposition: A | Discharge status: Home / Self Care | Payer: Medicare Other | Source: Ambulatory Visit | Attending: Adult Health | Admitting: Adult Health

## 2022-05-15 DIAGNOSIS — R2231 Localized swelling, mass and lump, right upper limb: Secondary | ICD-10-CM

## 2022-05-15 DIAGNOSIS — R2232 Localized swelling, mass and lump, left upper limb: Secondary | ICD-10-CM

## 2022-05-16 ENCOUNTER — Other Ambulatory Visit: Payer: Medicare Other

## 2022-05-22 ENCOUNTER — Other Ambulatory Visit: Payer: Medicare Other

## 2022-07-05 ENCOUNTER — Other Ambulatory Visit: Payer: Self-pay | Admitting: Nurse Practitioner

## 2022-07-05 DIAGNOSIS — R1310 Dysphagia, unspecified: Secondary | ICD-10-CM

## 2022-07-05 DIAGNOSIS — K219 Gastro-esophageal reflux disease without esophagitis: Secondary | ICD-10-CM

## 2022-07-17 ENCOUNTER — Ambulatory Visit
Admission: RE | Admit: 2022-07-17 | Discharge: 2022-07-17 | Disposition: A | Discharge status: Home / Self Care | Payer: Medicare Other | Source: Ambulatory Visit | Attending: Nurse Practitioner | Admitting: Nurse Practitioner

## 2022-07-17 DIAGNOSIS — K219 Gastro-esophageal reflux disease without esophagitis: Secondary | ICD-10-CM | POA: Insufficient documentation

## 2022-07-17 DIAGNOSIS — R1310 Dysphagia, unspecified: Secondary | ICD-10-CM | POA: Diagnosis present

## 2022-10-09 ENCOUNTER — Ambulatory Visit: Payer: Medicare Other | Admitting: Anesthesiology

## 2022-10-09 ENCOUNTER — Encounter
Admission: RE | Disposition: A | Discharge status: Home / Self Care | Payer: Self-pay | Source: Home / Self Care | Attending: Internal Medicine

## 2022-10-09 ENCOUNTER — Ambulatory Visit
Admission: RE | Admit: 2022-10-09 | Discharge: 2022-10-09 | Disposition: A | Discharge status: Home / Self Care | Payer: Medicare Other | Attending: Internal Medicine | Admitting: Internal Medicine

## 2022-10-09 ENCOUNTER — Encounter: Payer: Self-pay | Admitting: Internal Medicine

## 2022-10-09 DIAGNOSIS — I10 Essential (primary) hypertension: Secondary | ICD-10-CM | POA: Diagnosis not present

## 2022-10-09 DIAGNOSIS — Z1211 Encounter for screening for malignant neoplasm of colon: Secondary | ICD-10-CM | POA: Insufficient documentation

## 2022-10-09 DIAGNOSIS — K5900 Constipation, unspecified: Secondary | ICD-10-CM | POA: Diagnosis not present

## 2022-10-09 DIAGNOSIS — M797 Fibromyalgia: Secondary | ICD-10-CM | POA: Insufficient documentation

## 2022-10-09 DIAGNOSIS — K640 First degree hemorrhoids: Secondary | ICD-10-CM | POA: Insufficient documentation

## 2022-10-09 DIAGNOSIS — R1310 Dysphagia, unspecified: Secondary | ICD-10-CM | POA: Insufficient documentation

## 2022-10-09 DIAGNOSIS — K219 Gastro-esophageal reflux disease without esophagitis: Secondary | ICD-10-CM | POA: Insufficient documentation

## 2022-10-09 DIAGNOSIS — Z9049 Acquired absence of other specified parts of digestive tract: Secondary | ICD-10-CM | POA: Diagnosis not present

## 2022-10-09 HISTORY — PX: COLONOSCOPY: SHX5424

## 2022-10-09 HISTORY — DX: Other specified postprocedural states: Z98.890

## 2022-10-09 HISTORY — PX: ESOPHAGOGASTRODUODENOSCOPY: SHX5428

## 2022-10-09 SURGERY — COLONOSCOPY
Anesthesia: General

## 2022-10-09 MED ORDER — SODIUM CHLORIDE 0.9 % IV SOLN
INTRAVENOUS | Status: DC
  Administered 2022-10-09: 20 mL/h via INTRAVENOUS

## 2022-10-09 MED ORDER — MIDAZOLAM HCL 2 MG/2ML IJ SOLN
INTRAMUSCULAR | Status: DC | PRN
  Administered 2022-10-09: 2 mg via INTRAVENOUS

## 2022-10-09 MED ORDER — SODIUM CHLORIDE 0.9 % IV SOLN: INTRAVENOUS | Status: DC | PRN

## 2022-10-09 MED ORDER — STERILE WATER FOR IRRIGATION IR SOLN
Status: DC | PRN
  Administered 2022-10-09: 60 mL

## 2022-10-09 MED ORDER — KETAMINE HCL 50 MG/5ML IJ SOSY
PREFILLED_SYRINGE | INTRAMUSCULAR | Status: AC
  Filled 2022-10-09: qty 5

## 2022-10-09 MED ORDER — KETAMINE HCL 10 MG/ML IJ SOLN
INTRAMUSCULAR | Status: DC | PRN
  Administered 2022-10-09: 20 mg via INTRAVENOUS

## 2022-10-09 MED ORDER — MIDAZOLAM HCL 2 MG/2ML IJ SOLN
INTRAMUSCULAR | Status: AC
  Filled 2022-10-09: qty 2

## 2022-10-09 MED ORDER — PROPOFOL 500 MG/50ML IV EMUL
INTRAVENOUS | Status: DC | PRN
  Administered 2022-10-09: 150 ug/kg/min via INTRAVENOUS

## 2022-10-09 MED ORDER — PROPOFOL 10 MG/ML IV BOLUS
INTRAVENOUS | Status: DC | PRN
  Administered 2022-10-09: 60 mg via INTRAVENOUS
  Administered 2022-10-09: 20 mg via INTRAVENOUS

## 2022-10-09 NOTE — Anesthesia Preprocedure Evaluation (Addendum)
Anesthesia Evaluation  Patient identified by MRN, date of birth, ID band Patient awake    Reviewed: Allergy & Precautions, NPO status , Patient's Chart, lab work & pertinent test results  History of Anesthesia Complications (+) PONV and history of anesthetic complications  Airway Mallampati: III  TM Distance: <3 FB Neck ROM: full    Dental  (+) Chipped   Pulmonary neg shortness of breath,    Pulmonary exam normal        Cardiovascular hypertension, Normal cardiovascular exam     Neuro/Psych  Neuromuscular disease negative psych ROS   GI/Hepatic Neg liver ROS, GERD  Controlled,  Endo/Other  negative endocrine ROS  Renal/GU negative Renal ROS  negative genitourinary   Musculoskeletal   Abdominal   Peds  Hematology negative hematology ROS (+)   Anesthesia Other Findings Past Medical History: No date: Arthritis No date: Fibromyalgia No date: Hypertension  Past Surgical History: No date: ABDOMINAL HYSTERECTOMY No date: CHOLECYSTECTOMY  BMI    Body Mass Index: 42.36 kg/m      Reproductive/Obstetrics negative OB ROS                            Anesthesia Physical Anesthesia Plan  ASA: 3  Anesthesia Plan: General   Post-op Pain Management:    Induction: Intravenous  PONV Risk Score and Plan: Propofol infusion and TIVA  Airway Management Planned: Natural Airway and Nasal Cannula  Additional Equipment:   Intra-op Plan:   Post-operative Plan:   Informed Consent: I have reviewed the patients History and Physical, chart, labs and discussed the procedure including the risks, benefits and alternatives for the proposed anesthesia with the patient or authorized representative who has indicated his/her understanding and acceptance.     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  I consented her that this is a risk and she voiced assent.  Plan to have her open her own mouth pre sedation for endoscopy bite block placement.  Patient consented for risks of anesthesia including but not limited to:  - adverse reactions to medications - risk of airway placement if required - damage to eyes, teeth, lips or other oral mucosa - nerve damage due to positioning  - sore throat or hoarseness - Damage to heart, brain, nerves, lungs, other parts of body or loss of life  Patient voiced understanding.)        Anesthesia Quick Evaluation

## 2022-10-09 NOTE — H&P (Signed)
Outpatient short stay form Pre-procedure 10/09/2022 9:40 AM Sharon Boyd, M.D.  Primary Physician: Marletta Lor, NP  Reason for visit:  Dysphagia, Colon cancer screening, GERD  History of present illness:    Patient reports that she has problems with TMJ bilateral, and intact tonsils that gets swollen at times. No prior ENT evaluation. Whenever food hits the back of her throat it stops. She has to stop eating food, relax, and sometimes it passes other times she coughs it up. This happens with any solids and it used to be twice a week and now it is every day. This has been going on for quite a while. She drinks liquids without difficulty. Once in a while she coughs, but usually it just hangs up. Once the food passes the throat, she still feels it hanging up in the midsternal area transiently. She is afraid to eat alone at home. Positive stress- her son passed away last year and she has a custody of her 58 y.o. grandson. Patient presents for colonoscopy for colon cancer screening. She has a history of constipation and takes a stool softener that works pretty well. She has noted no anorexia, abdominal pain, change in bowel habits, or hematochezia.     Current Facility-Administered Medications:    0.9 %  sodium chloride infusion, , Intravenous, Continuous, [place], Boykin Nearing, MD, Last Rate: 20 mL/hr at 10/09/22 0903, 20 mL/hr at 10/09/22 0903  Medications Prior to Admission  Medication Sig Dispense Refill Last Dose   albuterol (VENTOLIN HFA) 108 (90 Base) MCG/ACT inhaler Inhale 2 puffs into the lungs every 6 (six) hours as needed.   Past Week   ALPRAZolam (XANAX) 0.5 MG tablet Take 1 tablet by mouth 2 (two) times daily as needed.   Past Week   DULoxetine (CYMBALTA) 60 MG capsule Take 60 mg by mouth daily.   10/08/2022   ergocalciferol (VITAMIN D2) 1.25 MG (50000 UT) capsule Take 1 capsule by mouth in the morning and at bedtime. Wednesday and saturday   Past Week   famotidine (PEPCID) 20 MG tablet  Take 1 tablet (20 mg total) by mouth 2 (two) times daily as needed for heartburn or indigestion. 60 tablet 1 Past Week   fluticasone (FLONASE) 50 MCG/ACT nasal spray Place 1 spray into both nostrils daily.   Past Week   Multiple Vitamin (MULTI-VITAMIN) tablet Take 1 tablet by mouth daily.   Past Week   oxyCODONE-acetaminophen (PERCOCET) 10-325 MG tablet Take 1 tablet by mouth 4 (four) times daily as needed.   10/08/2022   pregabalin (LYRICA) 150 MG capsule Take 150 mg by mouth 2 (two) times daily.   10/08/2022   simvastatin (ZOCOR) 40 MG tablet Take 1 tablet by mouth daily.   Past Week   SYMBICORT 160-4.5 MCG/ACT inhaler Inhale 1 puff into the lungs daily.   Past Week   tiZANidine (ZANAFLEX) 4 MG tablet Take 1 tablet by mouth daily.   Past Week   topiramate (TOPAMAX) 50 MG tablet Take 1 tablet by mouth in the morning and at bedtime.   Past Week   traZODone (DESYREL) 150 MG tablet Take 1 tablet by mouth at bedtime.   Past Week   metoprolol tartrate (LOPRESSOR) 25 MG tablet Take 0.5 tablets (12.5 mg total) by mouth 2 (two) times daily as needed (if systolic BP greater than 140 mmHg). 30 tablet 0    naloxone (NARCAN) nasal spray 4 mg/0.1 mL Place into the nose as directed.        No  Known Allergies   Past Medical History:  Diagnosis Date   Arthritis    Fibromyalgia    Hypertension    PONV (postoperative nausea and vomiting)     Review of systems:  Otherwise negative.    Physical Exam  Gen: Alert, oriented. Appears stated age.  HEENT: [place]/AT. PERRLA. Lungs: CTA, no wheezes. CV: RR nl S1, S2. Abd: soft, benign, no masses. BS+ Ext: No edema. Pulses 2+    Planned procedures: Proceed with EGD and colonoscopy. The patient understands the nature of the planned procedure, indications, risks, alternatives and potential complications including but not limited to bleeding, infection, perforation, damage to internal organs and possible oversedation/side effects from anesthesia. The patient  agrees and gives consent to proceed.  Please refer to procedure notes for findings, recommendations and patient disposition/instructions.     Sharon Boyd, M.D. Gastroenterology 10/09/2022  9:40 AM

## 2022-10-09 NOTE — Anesthesia Postprocedure Evaluation (Signed)
Anesthesia Post Note  Patient: Sharon Boyd  Procedure(s) Performed: COLONOSCOPY ESOPHAGOGASTRODUODENOSCOPY (EGD)  Patient location during evaluation: Endoscopy Anesthesia Type: General Level of consciousness: awake and alert Pain management: pain level controlled Vital Signs Assessment: post-procedure vital signs reviewed and stable Respiratory status: spontaneous breathing, nonlabored ventilation, respiratory function stable and patient connected to nasal cannula oxygen Cardiovascular status: blood pressure returned to baseline and stable Postop Assessment: no apparent nausea or vomiting Anesthetic complications: no   No notable events documented.   Last Vitals:  Vitals:   10/09/22 0848 10/09/22 1024  BP: 104/80 117/81  Pulse: 74 71  Resp: 20   Temp: (!) 36 C (!) 36 C  SpO2: 97% 99%    Last Pain:  Vitals:   10/09/22 1044  TempSrc:   PainSc: 0-No pain                 Precious Haws Hazell Siwik

## 2022-10-09 NOTE — Op Note (Signed)
[facility] Patient Name: Sharon Boyd Procedure Date: 10/09/2022 9:38 AM MRN: [id number] Account #: [id number] Date of Birth: 1956-05-03 Admit Type: Outpatient Age: 66 Room: [facility] ENDO ROOM 2 Gender: Female Note Status: Finalized Instrument Name: Altamese Cabal Endoscope [id number] Procedure:             Upper GI endoscopy Indications:           Dysphagia, Suspected gastro-esophageal reflux disease Providers:             Benay Pike. Alice Reichert MD, MD Referring MD:          Alvester Chou (Referring MD) Medicines:             Propofol per Anesthesia Complications:         No immediate complications. Procedure:             Pre-Anesthesia Assessment:                        - The risks and benefits of the procedure and the                         sedation options and risks were discussed with the                         patient. All questions were answered and informed                         consent was obtained.                        - Patient identification and proposed procedure were                         verified prior to the procedure by the nurse. The                         procedure was verified in the procedure room.                        - ASA Grade Assessment: III - A patient with severe                         systemic disease.                        - After reviewing the risks and benefits, the patient                         was deemed in satisfactory condition to undergo the                         procedure.                        After obtaining informed consent, the endoscope was                         passed under direct vision. Throughout the procedure,  the patient's blood pressure, pulse, and oxygen                         saturations were monitored continuously. The Endoscope                         was introduced through the mouth, and advanced to the                         third part of duodenum. The upper GI  endoscopy was                         accomplished without difficulty. The patient tolerated                         the procedure well. Findings:      No endoscopic abnormality was evident in the esophagus to explain the       patient's complaint of dysphagia. It was decided, however, to proceed       with dilation in the distal esophagus. The scope was withdrawn. Dilation       was performed with a Maloney dilator with no resistance at 54 Fr.      The esophagus was normal.      The examined duodenum was normal.      The stomach was normal. Impression:            - No endoscopic esophageal abnormality to explain                         patient's dysphagia. Esophagus dilated. Dilated.                        - Normal esophagus.                        - Normal examined duodenum.                        - Normal stomach.                        - No specimens collected. Recommendation:        - Monitor results to esophageal dilation                        - Continue present medications.                        - Proceed with colonoscopy Procedure Code(s):     --- Professional ---                        [phone], Esophagogastroduodenoscopy, flexible,                         transoral; diagnostic, including collection of                         specimen(s) by brushing or washing, when performed                         (separate procedure)  43450, Dilation of esophagus, by unguided sound or                         bougie, single or multiple passes Diagnosis Code(s):     --- Professional ---                        R13.10, Dysphagia, unspecified CPT copyright 2022 American Medical Association. All rights reserved. The codes documented in this report are preliminary and upon coder review may  be revised to meet current compliance requirements. Stanton Kidney MD, MD 10/09/2022 10:07:48 AM This report has been signed electronically. Number of Addenda: 0 Note Initiated On:  10/09/2022 9:38 AM Estimated Blood Loss:  Estimated blood loss: none.      [facility]

## 2022-10-09 NOTE — Op Note (Signed)
[facility] Patient Name: Sharon Boyd Procedure Date: 10/09/2022 9:38 AM MRN: [id number] Account #: [id number] Date of Birth: [birth year] Admit Type: Outpatient Age: 66 Room: [facility] ENDO ROOM 2 Gender: Female Note Status: Finalized Instrument Name: Prentice Docker [id number] Procedure:             Colonoscopy Indications:           Screening for colorectal malignant neoplasm Providers:             Royce Macadamia K. Norma Fredrickson MD, MD Referring MD:          Marletta Lor (Referring MD) Medicines:             Propofol per Anesthesia Complications:         No immediate complications. Procedure:             Pre-Anesthesia Assessment:                        - The risks and benefits of the procedure and the                         sedation options and risks were discussed with the                         patient. All questions were answered and informed                         consent was obtained.                        - Patient identification and proposed procedure were                         verified prior to the procedure by the nurse. The                         procedure was verified in the procedure room.                        - ASA Grade Assessment: III - A patient with severe                         systemic disease.                        - After reviewing the risks and benefits, the patient                         was deemed in satisfactory condition to undergo the                         procedure.                        After obtaining informed consent, the colonoscope was                         passed under direct vision. Throughout the procedure,                         the patient's  blood pressure, pulse, and oxygen                         saturations were monitored continuously. The                         Colonoscope was introduced through the anus and                         advanced to the the cecum, identified by appendiceal                          orifice and ileocecal valve. The colonoscopy was                         performed without difficulty. The patient tolerated                         the procedure well. The quality of the bowel                         preparation was good. The ileocecal valve, appendiceal                         orifice, and rectum were photographed. Findings:      The perianal and digital rectal examinations were normal. Pertinent       negatives include normal sphincter tone and no palpable rectal lesions.      Non-bleeding internal hemorrhoids were found during retroflexion. The       hemorrhoids were Grade I (internal hemorrhoids that do not prolapse).      The colon (entire examined portion) appeared normal. Impression:            - Non-bleeding internal hemorrhoids.                        - The entire examined colon is normal.                        - No specimens collected. Recommendation:        - Monitor results to esophageal dilation                        - Patient has a contact number available for                         emergencies. The signs and symptoms of potential                         delayed complications were discussed with the patient.                         Return to normal activities tomorrow. Written                         discharge instructions were provided to the patient.                        - Resume previous diet.                        -  Continue present medications.                        - Repeat colonoscopy in 10 years for screening                         purposes.                        - Return to GI office as previously scheduled.                        - Follow up with Dawson Bills, NP at [facility]                         Gastroenterology.                        - The findings and recommendations were discussed with                         the patient. Procedure Code(s):     --- Professional ---                        R4270, Colorectal cancer screening;  colonoscopy on                         individual not meeting criteria for high risk Diagnosis Code(s):     --- Professional ---                        K64.0, First degree hemorrhoids                        Z12.11, Encounter for screening for malignant neoplasm                         of colon CPT copyright 2022 American Medical Association. All rights reserved. The codes documented in this report are preliminary and upon coder review may  be revised to meet current compliance requirements. Efrain Sella MD, MD 10/09/2022 10:22:27 AM This report has been signed electronically. Number of Addenda: 0 Note Initiated On: 10/09/2022 9:38 AM Scope Withdrawal Time: 0 hours 6 minutes 44 seconds  Total Procedure Duration: 0 hours 10 minutes 13 seconds  Estimated Blood Loss:  Estimated blood loss: none.      [facility]

## 2022-10-09 NOTE — Transfer of Care (Addendum)
Immediate Anesthesia Transfer of Care Note  Patient: Sharon Boyd  Procedure(s) Performed: COLONOSCOPY ESOPHAGOGASTRODUODENOSCOPY (EGD)  Patient Location: PACU and Endoscopy Unit  Anesthesia Type:MAC  Level of Consciousness: drowsy  Airway & Oxygen Therapy: Patient Spontanous Breathing and Patient connected to nasal cannula oxygen  Post-op Assessment: Report given to RN and Post -op Vital signs reviewed and stable  Post vital signs: Reviewed and stable  Last Vitals:  Vitals Value Taken Time  BP    Temp    Pulse    Resp    SpO2      Last Pain:  Vitals:   10/09/22 0848  TempSrc: Temporal  PainSc: 0-No pain         Complications: No notable events documented.

## 2022-10-09 NOTE — Interval H&P Note (Signed)
History and Physical Interval Note:  10/09/2022 9:41 AM  Nonie Hoyer  has presented today for surgery, with the diagnosis of Gastroesophageal reflux disease, unspecified whether esophagitis present (K21.9) Dysphagia, unspecified type (R13.10) Colon cancer screening (Z12.11).  The various methods of treatment have been discussed with the patient and family. After consideration of risks, benefits and other options for treatment, the patient has consented to  Procedure(s): COLONOSCOPY (N/A) ESOPHAGOGASTRODUODENOSCOPY (EGD) (N/A) as a surgical intervention.  The patient's history has been reviewed, patient examined, no change in status, stable for surgery.  I have reviewed the patient's chart and labs.  Questions were answered to the patient's satisfaction.     [place], [place]

## 2022-10-10 ENCOUNTER — Encounter: Payer: Self-pay | Admitting: Internal Medicine
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
## 2022-11-21 ENCOUNTER — Encounter: Payer: Self-pay | Admitting: Psychiatry

## 2022-11-21 ENCOUNTER — Ambulatory Visit (INDEPENDENT_AMBULATORY_CARE_PROVIDER_SITE_OTHER): Payer: Medicare Other | Admitting: Psychiatry

## 2022-11-21 VITALS — BP 125/81 | HR 83 | Temp 97.8°F | Ht 69.5 in | Wt 297.8 lb

## 2022-11-21 DIAGNOSIS — F331 Major depressive disorder, recurrent, moderate: Secondary | ICD-10-CM

## 2022-11-21 DIAGNOSIS — F4310 Post-traumatic stress disorder, unspecified: Secondary | ICD-10-CM | POA: Diagnosis not present

## 2022-11-21 MED ORDER — DULOXETINE HCL 30 MG PO CPEP: 30.0000 mg | ORAL_CAPSULE | Freq: Every day | ORAL | 1 refills | Status: DC

## 2022-11-21 NOTE — Patient Instructions (Signed)
Change medication as follows:  Week 1: Continue duloxetine 60 mg daily, decease citalopram 10 mg daily  Week 2: Increase duloxetine 90 mg daily, discontinue citalopram  Week 3- Continue duloxetine 90 mg daily  Referral to therapy  Next appointment: 1/25 at 9 AM

## 2022-12-18 ENCOUNTER — Ambulatory Visit (INDEPENDENT_AMBULATORY_CARE_PROVIDER_SITE_OTHER): Payer: Medicare Other | Admitting: Psychology

## 2022-12-18 DIAGNOSIS — F331 Major depressive disorder, recurrent, moderate: Secondary | ICD-10-CM
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
## 2023-01-01 ENCOUNTER — Ambulatory Visit (INDEPENDENT_AMBULATORY_CARE_PROVIDER_SITE_OTHER): Payer: Medicare Other | Admitting: Psychology

## 2023-01-01 DIAGNOSIS — F331 Major depressive disorder, recurrent, moderate: Secondary | ICD-10-CM | POA: Diagnosis not present

## 2023-01-01 NOTE — Progress Notes (Signed)
Center Counselor/Therapist Progress Note  Patient ID: Sharon Boyd, MRN: [id number],    Date: 01/01/2023  Time Spent: 45 mins  Treatment Type: Individual Therapy  Reported Symptoms: Pt presents for session, via WebEx video.  Pt grants consent for the session, stating she is in her home with no one present.  I shared with pt that I am in my office with no one else present here.  Mental Status Exam: Appearance:  Casual     Behavior: Appropriate  Motor: Normal  Speech/Language:  Clear and Coherent  Affect: Appropriate  Mood: depressed  Thought process: normal  Thought content:   WNL  Sensory/Perceptual disturbances:   WNL  Orientation: oriented to person, place, and time/date  Attention: Good  Concentration: Good  Memory: WNL  Fund of knowledge:  Good  Insight:   Good  Judgment:  Good  Impulse Control: Good   Risk Assessment: Danger to Self:  No Self-injurious Behavior: No Danger to Others: No Duty to Warn:no Physical Aggression / Violence:No  Access to Firearms a concern: No  Gang Involvement:No   Subjective: Pt shares she has been doing OK since our initial session.  Pt shares that she has been spending some time this week with her oldest grandson, Sharon Boyd, he is a Print production planner and she is not sure what he wants to do after HS.  Pt shares she has not been sleeping well; she often takes melatonin but she still has trouble falling asleep and staying asleep.  She shares that she often worries about her grandson, Sharon Boyd, who she is responsible for.  He is in the first grade and sees a Astronomer twice a week outside of school and will be seeing Speech Therapist at school as well.  Pt shares she loves Sharon Boyd very much but she would prefer to be able to be his grandmother rather than having to be his mom and dad.  Pt shares that Sharon Boyd took her to the grocery store last Friday.  Next Friday she is going to [employer] home to celebrate their  birthdays and she is looking forward to that.  Sharon Boyd has also bought them crafts to do while they are there.  Pt shares that she tends to nap some during the day.  Encouraged pt to try to avoid napping during the day so that she can fall asleep better at night.  Pt has a follow up appt with Dr. Modesta Messing tomorrow and has a cardiology follow up next week.  Pt has to go to [employer] school on 1/29 to meet the school Speech Pathologist.  She drives herself to some appts and Sharon Boyd takes her to others.  Encouraged pt to try to talk with Sharon Boyd as frequently as she can to just engage with him, not necessarily asking for anything.  Also encouraged pt to think about what sorts of self care activities she might be able to engage in daily that will be good for her.  We will meet in 2 weeks for a follow up session.  Interventions: Cognitive Behavioral Therapy  Diagnosis:MDD (major depressive disorder), recurrent episode, moderate (HCC)  Plan: Treatment Plan Strengths/Abilities:  Intelligent, Intuitive, Willing to participate in therapy Treatment Preferences:  Outpatient Individual Therapy Statement of Needs:  Patient is to use CBT, mindfulness and coping skills to help manage and/or decrease symptoms associated with their diagnosis. Symptoms:  Depressed/Irritable mood, worry, social withdrawal Problems Addressed:  Depressive thoughts, Sadness, Sleep issues, etc. Long Term Goals:  Pt to reduce overall  level, frequency, and intensity of the feelings of depression as evidenced by decreased irritability, negative self talk, and helpless feelings from 6 to 7 days/week to 0 to 1 days/week, per client report, for at least 3 consecutive months.  Progress: 20% Short Term Goals:  Pt to verbally express understanding of the relationship between feelings of depression and their impact on thinking patterns and behaviors.  Pt to verbalize an understanding of the role that distorted thinking plays in creating fears, excessive  worry, and ruminations.  Progress: 20% Target Date:  01/02/2024 Frequency:  Bi-weekly Modality:  Cognitive Behavioral Therapy Interventions by Therapist:  Therapist will use CBT, Mindfulness exercises, Coping skills and Referrals, as needed by client. Client has verbally approved this treatment plan.  Sharon Boyd, [facility]

## 2023-01-02 ENCOUNTER — Ambulatory Visit (INDEPENDENT_AMBULATORY_CARE_PROVIDER_SITE_OTHER): Payer: Medicare Other | Admitting: Psychiatry

## 2023-01-02 ENCOUNTER — Encounter: Payer: Self-pay | Admitting: Psychiatry

## 2023-01-02 VITALS — BP 94/63 | HR 75 | Temp 97.8°F | Ht 69.5 in | Wt 301.2 lb

## 2023-01-02 DIAGNOSIS — G4700 Insomnia, unspecified: Secondary | ICD-10-CM | POA: Diagnosis not present

## 2023-01-02 DIAGNOSIS — F4310 Post-traumatic stress disorder, unspecified: Secondary | ICD-10-CM | POA: Diagnosis not present

## 2023-01-02 DIAGNOSIS — F331 Major depressive disorder, recurrent, moderate: Secondary | ICD-10-CM

## 2023-01-02 MED ORDER — DULOXETINE HCL 30 MG PO CPEP: 30.0000 mg | ORAL_CAPSULE | Freq: Every day | ORAL | 1 refills | Status: DC

## 2023-01-02 MED ORDER — DOXEPIN HCL 10 MG PO CAPS: 10.0000 mg | ORAL_CAPSULE | Freq: Every evening | ORAL | 2 refills | Status: DC | PRN

## 2023-01-02 NOTE — Patient Instructions (Signed)
Continue duloxetine 90 mg daily  Start doxepin 10 mg at night as needed for sleep Next appointment: 3/26 at 11:30

## 2023-01-15 ENCOUNTER — Ambulatory Visit (INDEPENDENT_AMBULATORY_CARE_PROVIDER_SITE_OTHER): Payer: Medicare Other | Admitting: Psychology

## 2023-01-15 DIAGNOSIS — F331 Major depressive disorder, recurrent, moderate: Secondary | ICD-10-CM

## 2023-01-15 NOTE — Progress Notes (Signed)
[facility] Counselor/Therapist Progress Note  Patient ID: Sharon Boyd, MRN: [id number],    Date: 01/15/2023  Time Spent: 45 mins  Treatment Type: Individual Therapy  Reported Symptoms: Pt presents for session, via WebEx video.  Pt grants consent for the session, stating she is in her home with no one present.  I shared with pt that I am in my office with no one else present here.  Mental Status Exam: Appearance:  Casual     Behavior: Appropriate  Motor: Normal  Speech/Language:  Clear and Coherent  Affect: Appropriate  Mood: depressed  Thought process: normal  Thought content:   WNL  Sensory/Perceptual disturbances:   WNL  Orientation: oriented to person, place, and time/date  Attention: Good  Concentration: Good  Memory: WNL  Fund of knowledge:  Good  Insight:   Good  Judgment:  Good  Impulse Control: Good   Risk Assessment: Danger to Self:  No Self-injurious Behavior: No Danger to Others: No Duty to Warn:no Physical Aggression / Violence:No  Access to Firearms a concern: No  Gang Involvement:No   Subjective: Pt shares she has been doing OK since our session.  "I have read 3 books since we talked last time and that was fun.  This week has been tough; my grandson has pink eye and I have been having to put eye drops in his eyes 3 times a day and he does not like them."  Pt shares that "everybody in the house has a cold."  Pt shares she has been thinking about her son who passed away "and I have been missing him a lot.  I have lost so much (my son, my husband, two grand kids, my mom and my dad)."  Pt talks about the difficulty she had giving birth to [name] (over 10 pounds at birth).  "[name] tells me all the time that [name] was my favorite.  [name] moved out of the house at 67 yo.  He ended up getting his GED; [name] dropped out of school his senior year but then he went back and graduated."  Pt shares that [name] has been spending more time in his room and  does not interact very much with others.  Pt shares that her cardiology appt went well; "they said I was doing fine and had nothing to be worried about."  Pt also saw Dr. Modesta Messing last week; she kept her dose of Cymbalta how it was.  She did increase her sleep medicine some to help her stay asleep.  Pt shares she was not able to meet with [name]'s Speech Therapist at school because pt was sick.  She is scheduled to see her on 2/28.  Encouraged pt to think of [name] and to remember the good times they shared and to focus on those good times when she gets sad about [name] being gone.  Pt shares that she was not able to get together for their "Sister's Lunch" but she couldn't go because pt was sick and could not go; this made her sad too.  She looks forward to seeing [name] again soon.  Encouraged pt to think about what sorts of self care activities she might be able to engage in daily that will be good for her.  We will meet in 2 weeks for a follow up session.  Interventions: Cognitive Behavioral Therapy  Diagnosis:MDD (major depressive disorder), recurrent episode, moderate (HCC)  Plan: Treatment Plan Strengths/Abilities:  Intelligent, Intuitive, Willing to participate in therapy Treatment Preferences:  Outpatient  Individual Therapy Statement of Needs:  Patient is to use CBT, mindfulness and coping skills to help manage and/or decrease symptoms associated with their diagnosis. Symptoms:  Depressed/Irritable mood, worry, social withdrawal Problems Addressed:  Depressive thoughts, Sadness, Sleep issues, etc. Long Term Goals:  Pt to reduce overall level, frequency, and intensity of the feelings of depression as evidenced by decreased irritability, negative self talk, and helpless feelings from 6 to 7 days/week to 0 to 1 days/week, per client report, for at least 3 consecutive months.  Progress: 20% Short Term Goals:  Pt to verbally express understanding of the relationship between feelings of depression and  their impact on thinking patterns and behaviors.  Pt to verbalize an understanding of the role that distorted thinking plays in creating fears, excessive worry, and ruminations.  Progress: 20% Target Date:  01/02/2024 Frequency:  Bi-weekly Modality:  Cognitive Behavioral Therapy Interventions by Therapist:  Therapist will use CBT, Mindfulness exercises, Coping skills and Referrals, as needed by client. Client has verbally approved this treatment plan.  Ivan Anchors, [facility]

## 2023-01-29 ENCOUNTER — Ambulatory Visit (INDEPENDENT_AMBULATORY_CARE_PROVIDER_SITE_OTHER): Payer: Medicare Other | Admitting: Psychology

## 2023-01-29 DIAGNOSIS — F331 Major depressive disorder, recurrent, moderate: Secondary | ICD-10-CM

## 2023-01-29 NOTE — Progress Notes (Signed)
[facility] Counselor/Therapist Progress Note  Patient ID: Sharon Boyd, MRN: [id number],    Date: 01/29/2023  Time Spent: 45 mins  Treatment Type: Individual Therapy  Reported Symptoms: Pt presents for session, via WebEx video.  Pt grants consent for the session, stating she is in her home with no one present.  I shared with pt that I am in my office with no one else present here.  Mental Status Exam: Appearance:  Casual     Behavior: Appropriate  Motor: Normal  Speech/Language:  Clear and Coherent  Affect: Appropriate  Mood: depressed  Thought process: normal  Thought content:   WNL  Sensory/Perceptual disturbances:   WNL  Orientation: oriented to person, place, and time/date  Attention: Good  Concentration: Good  Memory: WNL  Fund of knowledge:  Good  Insight:   Good  Judgment:  Good  Impulse Control: Good   Risk Assessment: Danger to Self:  No Self-injurious Behavior: No Danger to Others: No Duty to Warn:no Physical Aggression / Violence:No  Access to Firearms a concern: No  Gang Involvement:No   Subjective: Pt shares she has "been doing alright since our session.  I have been kind of depressed.  On Valentine's Day, my son and his wife went out and got things for the kids.  [name] got candy and flowers for his wife and he did not give me anything.  I talked to [place] about whether or not I have a right to feel hurt by that and she agreed that I did.  Sherry sent me flowers the next day and my brother sent me flowers the following day.  I appreciated getting those flowers."  On Sunday, [name] and his wife and sons went out to breakfast and they did not ask [name] and me and that hurt my feelings too.  Pt shares that she has not been sleeping well; "I have had 3 nights of insomnia since our last session.  I usually wake up between 230-300 am and stay awake for 1.5-2 hrs.  Pt tries not to nap during the day but sometimes she has to; she uses oxygen while she  sleeps to help her stay asleep; she cannot use her CPAP machine.  Pt shares that [name] continues to be mean to pt and [name] and she does not like that.  She just feels like she and [name] do not do anything for her and [name].  [name] got over his pink eye but he still has a cough.  Pt shares that she has been playing games on her phone and watching TV; she has not been reading as much this past week; encouraged pt to get back into reading again.  Pt shares she is supposed to meet with [name]'s Speech Therapist on 2/28.  Encouraged pt to think about what sorts of self care activities she might be able to engage in daily that will be good for her.  We will meet in 2 weeks for a follow up session.  Interventions: Cognitive Behavioral Therapy  Diagnosis:MDD (major depressive disorder), recurrent episode, moderate (HCC)  Plan: Treatment Plan Strengths/Abilities:  Intelligent, Intuitive, Willing to participate in therapy Treatment Preferences:  Outpatient Individual Therapy Statement of Needs:  Patient is to use CBT, mindfulness and coping skills to help manage and/or decrease symptoms associated with their diagnosis. Symptoms:  Depressed/Irritable mood, worry, social withdrawal Problems Addressed:  Depressive thoughts, Sadness, Sleep issues, etc. Long Term Goals:  Pt to reduce overall level, frequency, and intensity of the feelings  of depression as evidenced by decreased irritability, negative self talk, and helpless feelings from 6 to 7 days/week to 0 to 1 days/week, per client report, for at least 3 consecutive months.  Progress: 20% Short Term Goals:  Pt to verbally express understanding of the relationship between feelings of depression and their impact on thinking patterns and behaviors.  Pt to verbalize an understanding of the role that distorted thinking plays in creating fears, excessive worry, and ruminations.  Progress: 20% Target Date:  01/02/2024 Frequency:  Bi-weekly Modality:  Cognitive  Behavioral Therapy Interventions by Therapist:  Therapist will use CBT, Mindfulness exercises, Coping skills and Referrals, as needed by client. Client has verbally approved this treatment plan.  Ivan Anchors, [facility]

## 2023-02-12 ENCOUNTER — Ambulatory Visit (INDEPENDENT_AMBULATORY_CARE_PROVIDER_SITE_OTHER): Payer: Medicare Other | Admitting: Psychology

## 2023-02-12 DIAGNOSIS — F331 Major depressive disorder, recurrent, moderate: Secondary | ICD-10-CM

## 2023-02-12 NOTE — Progress Notes (Signed)
[facility] Counselor/Therapist Progress Note  Patient ID: Sharon Boyd, MRN: [id number],    Date: 02/12/2023  Time Spent: 45 mins  Treatment Type: Individual Therapy  Reported Symptoms: Pt presents for session, via WebEx video.  Pt grants consent for the session, stating she is in her home with no one present.  I shared with pt that I am in my office with no one else present here.  Mental Status Exam: Appearance:  Casual     Behavior: Appropriate  Motor: Normal  Speech/Language:  Clear and Coherent  Affect: Appropriate  Mood: depressed  Thought process: normal  Thought content:   WNL  Sensory/Perceptual disturbances:   WNL  Orientation: oriented to person, place, and time/date  Attention: Good  Concentration: Good  Memory: WNL  Fund of knowledge:  Good  Insight:   Good  Judgment:  Good  Impulse Control: Good   Risk Assessment: Danger to Self:  No Self-injurious Behavior: No Danger to Others: No Duty to Warn:no Physical Aggression / Violence:No  Access to Firearms a concern: No  Gang Involvement:No   Subjective: Pt shares she "has a big case of cellulitis in my legs.  I had a virtual visit with my doctor who put me on antibiotics.  This is the worst case I have ever had."  Pt called her PCP first thing on Sunday morning to get medicine for her condition.  Pt shares that [name] got angry at [name] because she had to wake him up for school on Monday and yelled at him because she was mad.  Pt shares she wishes she and [name] could live somewhere else but she does not know where that would be.  Pt also shares that [name] and [name] were fighting recently as well; "she has been in a bad mood lately."  Pt shares [name] has been calling her names too.  Pt shares that [name] works for [employer] in [place] and [name] works for a [employer], advertising near [place], [place].  Pt shares that she pays all of their mortgage, except for $250.00 each month.  Pt only has about $400.00 per  month to buy her's and [name]'s groceries and pay for their other needs.  Pt shares that she has not been sleeping well; "I have had 3 nights of insomnia since our last session because of a bad headache."  Pt shares that she has forgotten to use her oxygen lately at night.  Pt shares that [name] was cute because he was worried about her because she was not feeling well; she appreciated his attention for her.  [name] will be 67 yo in May; he is in the first grade.  He continues to see two different speech therapists each week.  Pt shares that she has been playing games on her phone and watching TV; "and I read a book and started a new one".  Pt shares that this coming Monday (3/11) she will be starting a Bible study with her sister-in-law and two other women.  Pt is glad she will be able to go to the Bible study on Mondays to get out of the house.  We will meet in 2 weeks for a follow up session.  Interventions: Cognitive Behavioral Therapy  Diagnosis:MDD (major depressive disorder), recurrent episode, moderate (HCC)  Plan: Treatment Plan Strengths/Abilities:  Intelligent, Intuitive, Willing to participate in therapy Treatment Preferences:  Outpatient Individual Therapy Statement of Needs:  Patient is to use CBT, mindfulness and coping skills to help manage and/or decrease symptoms  associated with their diagnosis. Symptoms:  Depressed/Irritable mood, worry, social withdrawal Problems Addressed:  Depressive thoughts, Sadness, Sleep issues, etc. Long Term Goals:  Pt to reduce overall level, frequency, and intensity of the feelings of depression as evidenced by decreased irritability, negative self talk, and helpless feelings from 6 to 7 days/week to 0 to 1 days/week, per client report, for at least 3 consecutive months.  Progress: 20% Short Term Goals:  Pt to verbally express understanding of the relationship between feelings of depression and their impact on thinking patterns and behaviors.  Pt to  verbalize an understanding of the role that distorted thinking plays in creating fears, excessive worry, and ruminations.  Progress: 20% Target Date:  01/02/2024 Frequency:  Bi-weekly Modality:  Cognitive Behavioral Therapy Interventions by Therapist:  Therapist will use CBT, Mindfulness exercises, Coping skills and Referrals, as needed by client. Client has verbally approved this treatment plan.  Ivan Anchors, [facility]

## 2023-02-13 ENCOUNTER — Emergency Department
Admission: EM | Admit: 2023-02-13 | Discharge: 2023-02-13 | Disposition: A | Discharge status: Home / Self Care | Payer: Medicare Other | Attending: Student in an Organized Health Care Education/Training Program | Admitting: Student in an Organized Health Care Education/Training Program

## 2023-02-13 ENCOUNTER — Other Ambulatory Visit: Payer: Self-pay

## 2023-02-13 ENCOUNTER — Encounter: Payer: Self-pay | Admitting: Intensive Care

## 2023-02-13 ENCOUNTER — Emergency Department: Payer: Medicare Other

## 2023-02-13 DIAGNOSIS — M7989 Other specified soft tissue disorders: Secondary | ICD-10-CM | POA: Diagnosis present

## 2023-02-13 DIAGNOSIS — L03116 Cellulitis of left lower limb: Secondary | ICD-10-CM | POA: Diagnosis not present

## 2023-02-13 LAB — COMPREHENSIVE METABOLIC PANEL
ALT: 18 U/L (ref 0–44)
AST: 20 U/L (ref 15–41)
Albumin: 3.6 g/dL (ref 3.5–5.0)
Alkaline Phosphatase: 93 U/L (ref 38–126)
Anion gap: 10 (ref 5–15)
BUN: 29 mg/dL — ABNORMAL HIGH (ref 8–23)
CO2: 28 mmol/L (ref 22–32)
Calcium: 9 mg/dL (ref 8.9–10.3)
Chloride: 99 mmol/L (ref 98–111)
Creatinine, Ser: 1 mg/dL (ref 0.44–1.00)
GFR, Estimated: >60 mL/min (ref >60)
Glucose, Bld: 141 mg/dL — ABNORMAL HIGH (ref 70–99)
Potassium: 2.7 mmol/L — CL (ref 3.5–5.1)
Sodium: 137 mmol/L (ref 135–145)
Total Bilirubin: 0.6 mg/dL (ref 0.3–1.2)
Total Protein: 7.9 g/dL (ref 6.5–8.1)

## 2023-02-13 LAB — CBC WITH DIFFERENTIAL/PLATELET
Abs Immature Granulocytes: 0.02 10*3/uL (ref 0.00–0.07)
Basophils Absolute: 0 10*3/uL (ref 0.0–0.1)
Basophils Relative: 1 %
Eosinophils Absolute: 0.1 10*3/uL (ref 0.0–0.5)
Eosinophils Relative: 1 %
HCT: 41.2 % (ref 36.0–46.0)
Hemoglobin: 13.4 g/dL (ref 12.0–15.0)
Immature Granulocytes: 0 %
Lymphocytes Relative: 16 %
Lymphs Abs: 1.3 10*3/uL (ref 0.7–4.0)
MCH: 25.8 pg — ABNORMAL LOW (ref 26.0–34.0)
MCHC: 32.5 g/dL (ref 30.0–36.0)
MCV: 79.4 fL — ABNORMAL LOW (ref 80.0–100.0)
Monocytes Absolute: 0.7 10*3/uL (ref 0.1–1.0)
Monocytes Relative: 9 %
Neutro Abs: 5.8 10*3/uL (ref 1.7–7.7)
Neutrophils Relative %: 73 %
Platelets: 229 10*3/uL (ref 150–400)
RBC: 5.19 MIL/uL — ABNORMAL HIGH (ref 3.87–5.11)
RDW: 14 % (ref 11.5–15.5)
WBC: 7.9 10*3/uL (ref 4.0–10.5)
nRBC: 0 % (ref 0.0–0.2)

## 2023-02-13 MED ORDER — PROBIOTIC 250 MG PO CAPS: 1.0000 | ORAL_CAPSULE | Freq: Two times a day (BID) | ORAL | 0 refills | Status: AC

## 2023-02-13 MED ORDER — POTASSIUM CHLORIDE CRYS ER 20 MEQ PO TBCR
40.0000 meq | EXTENDED_RELEASE_TABLET | Freq: Once | ORAL | Status: AC
  Administered 2023-02-13: 40 meq via ORAL
  Filled 2023-02-13: qty 2

## 2023-02-13 MED ORDER — MUPIROCIN 2 % EX OINT: 1.0000 | TOPICAL_OINTMENT | Freq: Two times a day (BID) | CUTANEOUS | 0 refills | Status: AC

## 2023-02-13 MED ORDER — CLINDAMYCIN HCL 300 MG PO CAPS: 300.0000 mg | ORAL_CAPSULE | Freq: Three times a day (TID) | ORAL | 0 refills | Status: AC

## 2023-02-13 NOTE — ED Provider Notes (Signed)
[facility] Provider Note    Event Date/Time   First MD Initiated Contact with Patient 02/13/23 [phone]     (approximate)   History   Cellulitis   HPI  Sharon Boyd is a 67 y.o. female with a history of psoriasis and lower extremity cellulitis presents to the ER due to concern for worsening swelling and concern for cellulitis of the left leg.  Did have some open sores in her night accidentally stepped in the feces her indoor dog.  Was put on doxycycline 4 days ago has been compliant with that medication and does not feel like the symptoms are getting any better.  Has had some low-grade temperature no vomiting.     Physical Exam   Triage Vital Signs: ED Triage Vitals  Enc Vitals Group     BP 02/13/23 0856 104/75     Pulse Rate 02/13/23 0856 71     Resp 02/13/23 0856 20     Temp 02/13/23 0857 98.2 F (36.8 C)     Temp Source 02/13/23 0856 Oral     SpO2 02/13/23 0856 95 %     Weight 02/13/23 0851 294 lb (133.4 kg)     Height 02/13/23 0851 '5\' 9"'$  (1.753 m)     Head Circumference --      Peak Flow --      Pain Score 02/13/23 0850 9     Pain Loc --      Pain Edu? --      Excl. in [place]? --     Most recent vital signs: Vitals:   02/13/23 0857 02/13/23 1100  BP:  117/84  Pulse:  67  Resp:  18  Temp: 98.2 F (36.8 C)   SpO2:       Constitutional: Alert  Eyes: Conjunctivae are normal.  Head: Atraumatic. Nose: No congestion/rhinnorhea. Mouth/Throat: Mucous membranes are moist.   Neck: Painless ROM.  Cardiovascular:   Good peripheral circulation.  Strong DP and PT pulse.  Cap refill. Respiratory: Normal respiratory effort.  No retractions.  Gastrointestinal: Soft and nontender.  Musculoskeletal:  no deformity Neurologic:  MAE spontaneously. No gross focal neurologic deficits are appreciated.  Skin:  Skin is warm, dry and intact.  Several chronic appearing sores likely related to history of psoriasis there is to missed ulcerations with some  cellulitic changes no purulence no bleeding.  No blistering.  Does have some pitting edema as compared to the right. Psychiatric: Mood and affect are normal. Speech and behavior are normal.    ED Results / Procedures / Treatments   Labs (all labs ordered are listed, but only abnormal results are displayed) Labs Reviewed  CBC WITH DIFFERENTIAL/PLATELET - Abnormal; Notable for the following components:      Result Value   RBC 5.19 (*)    MCV 79.4 (*)    MCH 25.8 (*)    All other components within normal limits  COMPREHENSIVE METABOLIC PANEL - Abnormal; Notable for the following components:   Potassium 2.7 (*)    Glucose, Bld 141 (*)    BUN 29 (*)    All other components within normal limits     EKG     RADIOLOGY Please see ED Course for my review and interpretation.  I personally reviewed all radiographic images ordered to evaluate for the above acute complaints and reviewed radiology reports and findings.  These findings were personally discussed with the patient.  Please see medical record for radiology report.  PROCEDURES:  Critical Care performed:   Procedures   MEDICATIONS ORDERED IN ED: Medications  potassium chloride SA (KLOR-CON M) CR tablet 40 mEq (40 mEq Oral Given 02/13/23 1031)     IMPRESSION / MDM / ASSESSMENT AND PLAN / ED COURSE  I reviewed the triage vital signs and the nursing notes.                              Differential diagnosis includes, but is not limited to, psoriasis, cellulitis, DVT, lymphedema, abscess  Patient presenting to the ER for evaluation of symptoms as described above.  Based on symptoms, risk factors and considered above differential, this presenting complaint could reflect a potentially life-threatening illness therefore the patient will be placed on continuous pulse oximetry and telemetry for monitoring.  Laboratory evaluation will be sent to evaluate for the above complaints.      Clinical Course as of 02/13/23 1206   Thu Feb 13, 2023  1202 Ultrasound without evidence of DVT.  She is not septic no white count reporting low-grade temperatures and does not feel like she is getting much improvement on the doxycycline therefore discussed options and patient would like to proceed with switching to clindamycin trial.  I do not feel that she requires IV antibiotics or admission to the hospital at this time does appear appropriate for outpatient follow-up.  I do suspect a lot of that skin changes are related to her known psoriasis we discussed the importance of following up with PCP and dermatology.  Patient agreeable with plan. [PR]    Clinical Course User Index [PR] Merlyn Lot, MD      FINAL CLINICAL IMPRESSION(S) / ED DIAGNOSES   Final diagnoses:  Cellulitis of left lower extremity     Rx / DC Orders   ED Discharge Orders          Ordered    clindamycin (CLEOCIN) 300 MG capsule  3 times daily        02/13/23 1206    mupirocin ointment (BACTROBAN) 2 %  2 times daily        02/13/23 1206    Saccharomyces boulardii (PROBIOTIC) 250 MG CAPS  2 times daily        02/13/23 1206             Note:  This document was prepared using Dragon voice recognition software and may include unintentional dictation errors.    Merlyn Lot, MD 02/13/23 [phone]

## 2023-02-13 NOTE — ED Notes (Signed)
Pt ambulated to toilet in room, steady gait in NAD.

## 2023-02-13 NOTE — ED Notes (Signed)
Pt to lobby with all belongings and discharge papers, waiting on friend to pick her up. Ambulated to chair from wheelchair, steady gait.

## 2023-02-13 NOTE — ED Triage Notes (Signed)
Patient reports cellulitis in left leg. Reports low grade fevers. Also has open wounds on bottom of her foot and stepped in her sons dogs stool in the dark and reports the next day she was sick and cellulitis appeared.

## 2023-02-26 ENCOUNTER — Ambulatory Visit (INDEPENDENT_AMBULATORY_CARE_PROVIDER_SITE_OTHER): Payer: Medicare Other | Admitting: Psychology

## 2023-02-26 DIAGNOSIS — F331 Major depressive disorder, recurrent, moderate: Secondary | ICD-10-CM | POA: Diagnosis not present

## 2023-02-26 NOTE — Progress Notes (Signed)
[facility] Counselor/Therapist Progress Note  Patient ID: Sharon Boyd, MRN: [id number],    Date: 02/26/2023  Time Spent: 45 mins  Treatment Type: Individual Therapy  Reported Symptoms: Pt presents for session, via WebEx video.  Pt grants consent for the session, stating she is in her home with no one present.  I shared with pt that I am in my office with no one else present here.  Mental Status Exam: Appearance:  Casual     Behavior: Appropriate  Motor: Normal  Speech/Language:  Clear and Coherent  Affect: Appropriate  Mood: depressed  Thought process: normal  Thought content:   WNL  Sensory/Perceptual disturbances:   WNL  Orientation: oriented to person, place, and time/date  Attention: Good  Concentration: Good  Memory: WNL  Fund of knowledge:  Good  Insight:   Good  Judgment:  Good  Impulse Control: Good   Risk Assessment: Danger to Self:  No Self-injurious Behavior: No Danger to Others: No Duty to Warn:no Physical Aggression / Violence:No  Access to Firearms a concern: No  Gang Involvement:No   Subjective: Pt shares she "has been doing OK."  Pt shares her legs have healed from the cellulitis she was suffering from last session.  Pt shares that [name] has been telling pt one thing and then telling [name] something different.  [name] was also turning off the power strip that controls the wifi in the home so the other kids would pay attention to him and play with him.  Pt shares that she and [name] are getting along better and they are trying to work together more for the benefit of the family.  [name] wants to see and talk with his mom Denyse Amass).  She still lives in [facility] and has not seen [name] in at least 2 yrs, since [place] died.  Pt does not like Denyse Amass because she has been responsible for pt getting arrested, she was bad to [name], she has not had any interest in [place], she uses drugs and alcohol, etc.  Pt feels she was responsible for  [name]'s death by OD.  [name] and Denyse Amass were together for over 12 yrs and [name] helped raised her daughter.  Pt believes [name] wants to talk with Denyse Amass because he misses his dad so much; [name] was 21 yo when [place] passed away.  Pt shares that she did start her Bible study this past week; it is focusing on weight loss and nutrition and includes scripture.  She has lost 6 pounds so far.  [name] will be 67 yo in May; he is in the first grade.  He continues to see two different speech therapists each week.  Pt shares that she has been playing games on her phone and watching TV; "and I read a book and started a new one."  We will meet in 3 weeks for a follow up session, due to [name] being on Spring Break in 2 wks.  Interventions: Cognitive Behavioral Therapy  Diagnosis:MDD (major depressive disorder), recurrent episode, moderate (HCC)  Plan: Treatment Plan Strengths/Abilities:  Intelligent, Intuitive, Willing to participate in therapy Treatment Preferences:  Outpatient Individual Therapy Statement of Needs:  Patient is to use CBT, mindfulness and coping skills to help manage and/or decrease symptoms associated with their diagnosis. Symptoms:  Depressed/Irritable mood, worry, social withdrawal Problems Addressed:  Depressive thoughts, Sadness, Sleep issues, etc. Long Term Goals:  Pt to reduce overall level, frequency, and intensity of the feelings of depression as evidenced by decreased irritability, negative self  talk, and helpless feelings from 6 to 7 days/week to 0 to 1 days/week, per client report, for at least 3 consecutive months.  Progress: 20% Short Term Goals:  Pt to verbally express understanding of the relationship between feelings of depression and their impact on thinking patterns and behaviors.  Pt to verbalize an understanding of the role that distorted thinking plays in creating fears, excessive worry, and ruminations.  Progress: 20% Target Date:  01/02/2024 Frequency:   Bi-weekly Modality:  Cognitive Behavioral Therapy Interventions by Therapist:  Therapist will use CBT, Mindfulness exercises, Coping skills and Referrals, as needed by client. Client has verbally approved this treatment plan.  Ivan Anchors, [facility]

## 2023-03-04 ENCOUNTER — Ambulatory Visit: Payer: Medicare Other | Admitting: Psychiatry
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
## 2023-03-06 ENCOUNTER — Encounter: Payer: Self-pay | Admitting: Psychiatry

## 2023-03-06 ENCOUNTER — Telehealth (INDEPENDENT_AMBULATORY_CARE_PROVIDER_SITE_OTHER): Payer: Medicare Other | Admitting: Psychiatry

## 2023-03-06 DIAGNOSIS — F331 Major depressive disorder, recurrent, moderate: Secondary | ICD-10-CM | POA: Diagnosis not present

## 2023-03-06 DIAGNOSIS — G4700 Insomnia, unspecified: Secondary | ICD-10-CM | POA: Diagnosis not present

## 2023-03-06 DIAGNOSIS — F4310 Post-traumatic stress disorder, unspecified: Secondary | ICD-10-CM

## 2023-03-06 MED ORDER — DULOXETINE HCL 30 MG PO CPEP: 30.0000 mg | ORAL_CAPSULE | Freq: Every day | ORAL | 1 refills | Status: DC

## 2023-03-19 ENCOUNTER — Ambulatory Visit (INDEPENDENT_AMBULATORY_CARE_PROVIDER_SITE_OTHER): Payer: Medicare Other | Admitting: Psychology

## 2023-03-19 DIAGNOSIS — F331 Major depressive disorder, recurrent, moderate: Secondary | ICD-10-CM

## 2023-03-19 NOTE — Progress Notes (Signed)
[facility] Counselor/Therapist Progress Note  Patient ID: Sharon Boyd, MRN: [id number],    Date: 03/19/2023  Time Spent: 45 mins  Treatment Type: Individual Therapy  Reported Symptoms: Pt presents for session, via WebEx video.  Pt grants consent for the session, stating she is in her home with no one present.  I shared with pt that I am in my office with no one else present here.  Mental Status Exam: Appearance:  Casual     Behavior: Appropriate  Motor: Normal  Speech/Language:  Clear and Coherent  Affect: Appropriate  Mood: depressed  Thought process: normal  Thought content:   WNL  Sensory/Perceptual disturbances:   WNL  Orientation: oriented to person, place, and time/date  Attention: Good  Concentration: Good  Memory: WNL  Fund of knowledge:  Good  Insight:   Good  Judgment:  Good  Impulse Control: Good   Risk Assessment: Danger to Self:  No Self-injurious Behavior: No Danger to Others: No Duty to Warn:no Physical Aggression / Violence:No  Access to Firearms a concern: No  Gang Involvement:No   Subjective: Pt shares she "has been doing OK; Odis Luster was hard; that is the last day I saw [name] before he died so it is always a hard day.  We went to [employer] house for lunch and then all the kids had an [employer] and that was fun for them."  Pt shares that seeing the kids have fun was good for her.  She and [name] both missed [name] being there.  [name] and [name] took the kids to her mom's for a couple of days over Spring Break and did not tell pt they were going; "It made me feel kind of invisible; they ignored me while the kids were gone."  Pt shares that she was raised by her grandmother and she never bonded with her mom.  Her mom has passed away as has her grandmother.  She did mend her relationship with her mom and cared for her mom for the last 5 yrs of her life.  Pt shares she ran away from home when she was 67 yo and her parents packed all of her  clothes and put them outside; she came back after 2 days but then got punished from her parents.  [name] has not talked anymore about wanting to see his mom Verdon Cummins) since our last session and pt is happy about that.  Pt shares she continues to do her Bible study and "It seems to be helping me to get out and talk to other people."  She likes the people in her Bible study and she has lost 5 pounds so far.  [name] will be 67 yo in May; he is in the first grade.  He continues to see two different speech therapists each week.  Pt shares she struggles with reading but doing great in [place].  Pt shares that she has been playing games on her phone and watching TV; "and I read one book and started a new one."  We will meet in 2 wks for a follow up session.  Interventions: Cognitive Behavioral Therapy  Diagnosis:MDD (major depressive disorder), recurrent episode, moderate  Plan: Treatment Plan Strengths/Abilities:  Intelligent, Intuitive, Willing to participate in therapy Treatment Preferences:  Outpatient Individual Therapy Statement of Needs:  Patient is to use CBT, mindfulness and coping skills to help manage and/or decrease symptoms associated with their diagnosis. Symptoms:  Depressed/Irritable mood, worry, social withdrawal Problems Addressed:  Depressive thoughts, Sadness, Sleep  issues, etc. Long Term Goals:  Pt to reduce overall level, frequency, and intensity of the feelings of depression as evidenced by decreased irritability, negative self talk, and helpless feelings from 6 to 7 days/week to 0 to 1 days/week, per client report, for at least 3 consecutive months.  Progress: 20% Short Term Goals:  Pt to verbally express understanding of the relationship between feelings of depression and their impact on thinking patterns and behaviors.  Pt to verbalize an understanding of the role that distorted thinking plays in creating fears, excessive worry, and ruminations.  Progress: 20% Target Date:   01/02/2024 Frequency:  Bi-weekly Modality:  Cognitive Behavioral Therapy Interventions by Therapist:  Therapist will use CBT, Mindfulness exercises, Coping skills and Referrals, as needed by client. Client has verbally approved this treatment plan.  Karie Kirks, [facility]

## 2023-04-03 ENCOUNTER — Ambulatory Visit: Payer: Medicare Other | Admitting: Psychology

## 2023-04-09 ENCOUNTER — Other Ambulatory Visit: Payer: Self-pay | Admitting: Psychiatry

## 2023-04-10 ENCOUNTER — Ambulatory Visit (INDEPENDENT_AMBULATORY_CARE_PROVIDER_SITE_OTHER): Payer: Medicare HMO | Admitting: Psychology

## 2023-04-10 DIAGNOSIS — F331 Major depressive disorder, recurrent, moderate: Secondary | ICD-10-CM
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
## 2023-04-23 ENCOUNTER — Ambulatory Visit (INDEPENDENT_AMBULATORY_CARE_PROVIDER_SITE_OTHER): Payer: Medicare HMO | Admitting: Psychology

## 2023-04-23 DIAGNOSIS — F331 Major depressive disorder, recurrent, moderate: Secondary | ICD-10-CM | POA: Diagnosis not present
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
## 2023-04-30 ENCOUNTER — Telehealth (INDEPENDENT_AMBULATORY_CARE_PROVIDER_SITE_OTHER): Payer: Medicare HMO | Admitting: Psychiatry

## 2023-04-30 ENCOUNTER — Encounter: Payer: Self-pay | Admitting: Psychiatry

## 2023-04-30 DIAGNOSIS — F3341 Major depressive disorder, recurrent, in partial remission: Secondary | ICD-10-CM | POA: Diagnosis not present

## 2023-04-30 DIAGNOSIS — G4700 Insomnia, unspecified: Secondary | ICD-10-CM | POA: Diagnosis not present

## 2023-04-30 DIAGNOSIS — F4310 Post-traumatic stress disorder, unspecified: Secondary | ICD-10-CM | POA: Diagnosis not present

## 2023-04-30 MED ORDER — DULOXETINE HCL 30 MG PO CPEP: 30.0000 mg | ORAL_CAPSULE | Freq: Every day | ORAL | 2 refills | Status: DC

## 2023-04-30 NOTE — Patient Instructions (Signed)
Continue duloxetine 90 mg daily  Decrease doxepin 3 mg at night as needed for insomnia  Next appointment: 7/10 at 11 30

## 2023-05-07 ENCOUNTER — Ambulatory Visit: Payer: Medicare HMO | Admitting: Psychology

## 2023-05-07 DIAGNOSIS — F331 Major depressive disorder, recurrent, moderate: Secondary | ICD-10-CM | POA: Diagnosis not present

## 2023-05-07 NOTE — Progress Notes (Signed)
[facility] Counselor/Therapist Progress Note  Patient ID: Sharon Boyd, MRN: [id number],    Date: 05/07/2023  Time Spent: 45 mins  Treatment Type: Individual Therapy  Reported Symptoms: Pt presents for session, via Caregility video.  Pt grants consent for the session, stating she is in her home with no one present.  I shared with pt that I am in my office with no one else present here.  Mental Status Exam: Appearance:  Casual     Behavior: Appropriate  Motor: Normal  Speech/Language:  Clear and Coherent  Affect: Appropriate  Mood: depressed  Thought process: normal  Thought content:   WNL  Sensory/Perceptual disturbances:   WNL  Orientation: oriented to person, place, and time/date  Attention: Good  Concentration: Good  Memory: WNL  Fund of knowledge:  Good  Insight:   Good  Judgment:  Good  Impulse Control: Good   Risk Assessment: Danger to Self:  No Self-injurious Behavior: No Danger to Others: No Duty to Warn:no Physical Aggression / Violence:No  Access to Firearms a concern: No  Gang Involvement:No   Subjective: Pt shares that "Things have been up and down between me and [name].  She will answer if I ask questions and she is not hateful anymore.  She won't carry on a conversation with me but she is not hateful."  Pt also shares that she has a sinus headache today and has had it for 3-4 days; pt does not normally take an allergy pill daily.  Pt shares some family history about how [name] tends to be isolated from anyone else in the family; "He only has me."  Pt shares that she had [name]'s birthday (65 yo) at [employer] last weekend and he had fun; several family members came with their kids and pt appreciated him having other kids to play with.  Pt shares that she believes that [name] and [name] had cocaine this past weekend and spent the whole weekend in their bedroom using.  Pt shares they often say they don't have enough money for bills but it is because  they are buying drugs.  [name] and [name] do continue to work regularly.  Pt gets mad that they are not managing their money well and are using drugs in the house.  [name] will be 67 yo in July and graduates from [employer] in June and Wilber Oliphant is 67 yo; Wilber Oliphant has a history of anxiety attacks.  [name] is still scheduled for his hernia surgery at [facility] on 6/17.  Pt shares that Brayton Caves, [employer] mom, has a total of 9 children by lots of different men.  Pt shares that she has finished a book but has not started a new one.  Pt shares her "BP is going crazy; I have an appt with my kidney doctor on 6/12 because he is the one managing my BP."  [name] graduates from [employer] on 6/7 and pt is planning to go to that event.  Encouraged pt to continue with her self care activities and we will meet in 2 wks for a follow up session.  Interventions: Cognitive Behavioral Therapy  Diagnosis:MDD (major depressive disorder), recurrent episode, moderate (HCC)  Plan: Treatment Plan Strengths/Abilities:  Intelligent, Intuitive, Willing to participate in therapy Treatment Preferences:  Outpatient Individual Therapy Statement of Needs:  Patient is to use CBT, mindfulness and coping skills to help manage and/or decrease symptoms associated with their diagnosis. Symptoms:  Depressed/Irritable mood, worry, social withdrawal Problems Addressed:  Depressive thoughts, Sadness, Sleep issues, etc. Long Term  Goals:  Pt to reduce overall level, frequency, and intensity of the feelings of depression as evidenced by decreased irritability, negative self talk, and helpless feelings from 6 to 7 days/week to 0 to 1 days/week, per client report, for at least 3 consecutive months.  Progress: 20% Short Term Goals:  Pt to verbally express understanding of the relationship between feelings of depression and their impact on thinking patterns and behaviors.  Pt to verbalize an understanding of the role that distorted thinking plays in creating fears, excessive  worry, and ruminations.  Progress: 20% Target Date:  01/02/2024 Frequency:  Bi-weekly Modality:  Cognitive Behavioral Therapy Interventions by Therapist:  Therapist will use CBT, Mindfulness exercises, Coping skills and Referrals, as needed by client. Client has verbally approved this treatment plan.  Karie Kirks, [facility]

## 2023-05-21 ENCOUNTER — Ambulatory Visit: Payer: Medicare HMO | Admitting: Psychology

## 2023-05-29 ENCOUNTER — Ambulatory Visit (INDEPENDENT_AMBULATORY_CARE_PROVIDER_SITE_OTHER): Payer: Medicare HMO | Admitting: Psychology

## 2023-05-29 DIAGNOSIS — F331 Major depressive disorder, recurrent, moderate: Secondary | ICD-10-CM | POA: Diagnosis not present

## 2023-05-29 NOTE — Progress Notes (Signed)
[facility] Counselor/Therapist Progress Note  Patient ID: Sharon Boyd, MRN: [id number],    Date: 05/29/2023  Time Spent: 45 mins  Treatment Type: Individual Therapy  Reported Symptoms: Pt presents for session, via Caregility video.  Pt grants consent for the session and stating her understanding of the limits of virtual sessions, stating she is in her home with no one present.  I shared with pt that I am in my office with no one else present here.  Mental Status Exam: Appearance:  Casual     Behavior: Appropriate  Motor: Normal  Speech/Language:  Clear and Coherent  Affect: Appropriate  Mood: depressed  Thought process: normal  Thought content:   WNL  Sensory/Perceptual disturbances:   WNL  Orientation: oriented to person, place, and time/date  Attention: Good  Concentration: Good  Memory: WNL  Fund of knowledge:  Good  Insight:   Good  Judgment:  Good  Impulse Control: Good   Risk Assessment: Danger to Self:  No Self-injurious Behavior: No Danger to Others: No Duty to Warn:no Physical Aggression / Violence:No  Access to Firearms a concern: No  Gang Involvement:No   Subjective: Pt shares that "Sharon Boyd had his hernia surgery on Monday and is recovering fine.  No one in the house has asked Sharon Boyd how he is doing or how the surgery went.  Sharon Boyd has gone back to being distant and it feels just like before."  Pt shares that she missed last week's appt because she overslept.  She has been busy taking Sharon Boyd back and forth to [facility]; he has an ENT appt tomorrow.  Sharon Boyd has been taking her to the appts.  Sharon Boyd also took Sharon Boyd to her home with her other grandchildren for the whole weekend and they all had a great time; pt had dinner with Sharon Boyd on Sunday.  "I don't know what I would do without Sharon Boyd; she is really good to me."  Pt shares that her sleep is "about the same"; sometimes pt has trouble falling asleep; her psychiatrist gave her Doxepin for sleep but  it makes pt too drowsy the next day so she does not take it very often.  Pt continues to have sinus issues; she shares that she has a deviated septum and that complicates her sinus issues.  Pt shares that Sharon Boyd and Sharon Boyd have gotten a loan to pay off their credit cards and pay for his truck getting fixed.  Pt shares she has not had much time to read since the kids are out of school but she hopes to get back into it soon.  Pt shares that her BP has been better since our last session; she is still having hot flashes from time to time.  Encouraged pt to continue with her self care activities and we will meet in 2 wks for a follow up session.  Interventions: Cognitive Behavioral Therapy  Diagnosis:MDD (major depressive disorder), recurrent episode, moderate (HCC)  Plan: Treatment Plan Strengths/Abilities:  Intelligent, Intuitive, Willing to participate in therapy Treatment Preferences:  Outpatient Individual Therapy Statement of Needs:  Patient is to use CBT, mindfulness and coping skills to help manage and/or decrease symptoms associated with their diagnosis. Symptoms:  Depressed/Irritable mood, worry, social withdrawal Problems Addressed:  Depressive thoughts, Sadness, Sleep issues, etc. Long Term Goals:  Pt to reduce overall level, frequency, and intensity of the feelings of depression as evidenced by decreased irritability, negative self talk, and helpless feelings from 6 to 7 days/week to 0 to 1 days/week, per client  report, for at least 3 consecutive months.  Progress: 20% Short Term Goals:  Pt to verbally express understanding of the relationship between feelings of depression and their impact on thinking patterns and behaviors.  Pt to verbalize an understanding of the role that distorted thinking plays in creating fears, excessive worry, and ruminations.  Progress: 20% Target Date:  01/02/2024 Frequency:  Bi-weekly Modality:  Cognitive Behavioral Therapy Interventions by Therapist:  Therapist will  use CBT, Mindfulness exercises, Coping skills and Referrals, as needed by client. Client has verbally approved this treatment plan.  Karie Kirks, [facility]

## 2023-06-11 ENCOUNTER — Ambulatory Visit: Payer: Medicare HMO | Admitting: Psychology

## 2023-06-11 DIAGNOSIS — F331 Major depressive disorder, recurrent, moderate: Secondary | ICD-10-CM

## 2023-06-11 NOTE — Progress Notes (Signed)
[facility] Counselor/Therapist Progress Note  Patient ID: Sharon Boyd, MRN: [id number],    Date: 06/11/2023  Time Spent: 45 mins      start: 1300     end: 1345  Treatment Type: Individual Therapy  Reported Symptoms: Pt presents for session, via Caregility video.  Pt grants consent for the session and stating her understanding of the limits of virtual sessions, states she is in her home with no one present.  I shared with pt that I am in my office with no one else present here.  Mental Status Exam: Appearance:  Casual     Behavior: Appropriate  Motor: Normal  Speech/Language:  Clear and Coherent  Affect: Appropriate  Mood: depressed  Thought process: normal  Thought content:   WNL  Sensory/Perceptual disturbances:   WNL  Orientation: oriented to person, place, and time/date  Attention: Good  Concentration: Good  Memory: WNL  Fund of knowledge:  Good  Insight:   Good  Judgment:  Good  Impulse Control: Good   Risk Assessment: Danger to Self:  No Self-injurious Behavior: No Danger to Others: No Duty to Warn:no Physical Aggression / Violence:No  Access to Firearms a concern: No  Gang Involvement:No   Subjective: Pt shares that "I have been pretty good since our last session.  I almost had a car but it didn't work out.  My sister asked her mechanic to keep his eyes open for a used car for me; he found one but another lady bought it.  [name] said she was going to buy it for me; she is still willing to buy me a car.  She does not want me to be stuck in this house; she wants me to have some freedom."  Pt shares that [name] has completely recovered from his hernia surgery.  Pt shares that [name] is taking Singulair for allergies.  Pt has a pain mgmt appt next Tuesday and she sees her psychiatrist on Wed morning and the kidney doctor on Wed afternoon.  She also has a monthly pain mgmt appt.  Pt shares that she has started a new book last week; she also got a new  piece of exercise equipment and she has been using it regularly for the past week.  Encouraged pt to continue with her self care activities and we will meet in 2 wks for a follow up session.  Interventions: Cognitive Behavioral Therapy  Diagnosis:MDD (major depressive disorder), recurrent episode, moderate (HCC)  Plan: Treatment Plan Strengths/Abilities:  Intelligent, Intuitive, Willing to participate in therapy Treatment Preferences:  Outpatient Individual Therapy Statement of Needs:  Patient is to use CBT, mindfulness and coping skills to help manage and/or decrease symptoms associated with their diagnosis. Symptoms:  Depressed/Irritable mood, worry, social withdrawal Problems Addressed:  Depressive thoughts, Sadness, Sleep issues, etc. Long Term Goals:  Pt to reduce overall level, frequency, and intensity of the feelings of depression as evidenced by decreased irritability, negative self talk, and helpless feelings from 6 to 7 days/week to 0 to 1 days/week, per client report, for at least 3 consecutive months.  Progress: 20% Short Term Goals:  Pt to verbally express understanding of the relationship between feelings of depression and their impact on thinking patterns and behaviors.  Pt to verbalize an understanding of the role that distorted thinking plays in creating fears, excessive worry, and ruminations.  Progress: 20% Target Date:  01/02/2024 Frequency:  Bi-weekly Modality:  Cognitive Behavioral Therapy Interventions by Therapist:  Therapist will use CBT, Mindfulness exercises,  Coping skills and Referrals, as needed by client. Client has verbally approved this treatment plan.  Ivan Anchors, [facility]
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
## 2023-06-18 ENCOUNTER — Encounter: Payer: Medicare HMO | Admitting: Psychiatry

## 2023-06-18 ENCOUNTER — Telehealth: Payer: Self-pay | Admitting: Psychiatry
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
## 2023-06-18 NOTE — Telephone Encounter (Signed)
Sent a video visit link through Epic, but the patient didn't sign in. Tried calling for today's appointment, but got no answer. Left a voicemail instructing the patient to contact the office at [phone].  

## 2023-06-18 NOTE — Progress Notes (Signed)
This encounter was created in error - please disregard.

## 2023-06-19 ENCOUNTER — Encounter: Payer: Medicare HMO | Admitting: Psychiatry

## 2023-06-19 ENCOUNTER — Telehealth: Payer: Self-pay | Admitting: Psychiatry

## 2023-06-19 NOTE — Telephone Encounter (Signed)
Sent a video visit link through Epic, but the patient didn't sign in. Tried calling for today's appointment, but got no answer. Left a voicemail instructing the patient to contact the office at [phone].  

## 2023-06-19 NOTE — Progress Notes (Signed)
This encounter was created in error - please disregard.

## 2023-06-27 ENCOUNTER — Ambulatory Visit (INDEPENDENT_AMBULATORY_CARE_PROVIDER_SITE_OTHER): Payer: Medicare HMO | Admitting: Psychology

## 2023-06-27 DIAGNOSIS — F331 Major depressive disorder, recurrent, moderate: Secondary | ICD-10-CM | POA: Diagnosis not present
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
## 2023-07-14 ENCOUNTER — Telehealth (INDEPENDENT_AMBULATORY_CARE_PROVIDER_SITE_OTHER): Payer: Medicare HMO | Admitting: Psychiatry

## 2023-07-14 ENCOUNTER — Encounter: Payer: Self-pay | Admitting: Psychiatry

## 2023-07-14 DIAGNOSIS — F4310 Post-traumatic stress disorder, unspecified: Secondary | ICD-10-CM

## 2023-07-14 DIAGNOSIS — F331 Major depressive disorder, recurrent, moderate: Secondary | ICD-10-CM | POA: Diagnosis not present

## 2023-07-14 DIAGNOSIS — G4700 Insomnia, unspecified: Secondary | ICD-10-CM

## 2023-07-14 MED ORDER — DULOXETINE HCL 30 MG PO CPEP: 30.0000 mg | ORAL_CAPSULE | Freq: Every day | ORAL | 2 refills | Status: DC

## 2023-07-14 MED ORDER — DOXEPIN HCL 3 MG PO TABS: 3.0000 mg | ORAL_TABLET | Freq: Every evening | ORAL | 1 refills | Status: DC | PRN

## 2023-07-14 NOTE — Patient Instructions (Signed)
Continue duloxetine 90 mg daily  Continue doxepin 3 mg at night as needed for insomnia  Next appointment: 9/19 at 8 am

## 2023-07-16 ENCOUNTER — Ambulatory Visit (INDEPENDENT_AMBULATORY_CARE_PROVIDER_SITE_OTHER): Payer: Medicare HMO | Admitting: Psychology

## 2023-07-16 DIAGNOSIS — F331 Major depressive disorder, recurrent, moderate: Secondary | ICD-10-CM | POA: Diagnosis not present
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
## 2023-07-30 ENCOUNTER — Ambulatory Visit: Payer: Medicare HMO | Admitting: Psychology

## 2023-07-30 DIAGNOSIS — F331 Major depressive disorder, recurrent, moderate: Secondary | ICD-10-CM

## 2023-07-30 NOTE — Progress Notes (Signed)
[facility] Counselor/Therapist Progress Note  Patient ID: Sharon Boyd, MRN: [id number],    Date: 07/30/2023  Time Spent: 45 mins      start: 1100     end: 1145  Treatment Type: Individual Therapy  Reported Symptoms: Pt presents for session, via Caregility video.  Pt grants consent for the session and stating her understanding of the limits of virtual sessions, states she is in her home with no one present.  I shared with pt that I am in my office with no one else present here.  Mental Status Exam: Appearance:  Casual     Behavior: Appropriate  Motor: Normal  Speech/Language:  Clear and Coherent  Affect: Appropriate  Mood: depressed  Thought process: normal  Thought content:   WNL  Sensory/Perceptual disturbances:   WNL  Orientation: oriented to person, place, and time/date  Attention: Good  Concentration: Good  Memory: WNL  Fund of knowledge:  Good  Insight:   Good  Judgment:  Good  Impulse Control: Good   Risk Assessment: Danger to Self:  No Self-injurious Behavior: No Danger to Others: No Duty to Warn:no Physical Aggression / Violence:No  Access to Firearms a concern: No  Gang Involvement:No   Subjective: Pt shares that "I have been doing pretty good since our last session.  [name] starts school on Monday so they wanted me to re-start his ADHD medication to get him used to it again."  Pt shares that he will be in second grade this year.  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  Pt shares that [name] wants to go outside and ride is bicycle today because it is such a beautiful day today.  Pt talks about the extensive drug use in her family (pt, Raymondo Band and [name]).  Bobby's father was a drinker as well and "was a mean drunk."  Pt shares that her father was a Programmer, multimedia and neither he nor her mom  drank or used drugs.  Pt shares she has started reading another series of books; she has bought herself a small piece of exercise equipment and enjoys using that. Pt shares that [name] is doing well; she took pt and [name] to doctors appts last week.  She is enjoying her new mattress still.  Encouraged pt to continue with her self care activities and we will meet in 2 wks for a follow up session.  Interventions: Cognitive Behavioral Therapy  Diagnosis:MDD (major depressive disorder), recurrent episode, moderate (HCC)  Plan: Treatment Plan Strengths/Abilities:  Intelligent, Intuitive, Willing to participate in therapy Treatment Preferences:  Outpatient Individual Therapy Statement of Needs:  Patient is to use CBT, mindfulness and coping skills to help manage and/or decrease symptoms associated with their diagnosis. Symptoms:  Depressed/Irritable mood, worry, social withdrawal Problems Addressed:  Depressive thoughts, Sadness, Sleep issues, etc. Long Term Goals:  Pt to reduce overall level, frequency, and intensity of the feelings of depression as evidenced by decreased irritability, negative self talk, and helpless feelings from 6 to 7 days/week to 0 to 1 days/week, per client report, for at least 3 consecutive months.  Progress: 20% Short Term Goals:  Pt to verbally express understanding of the relationship between feelings of depression and their impact on thinking patterns and behaviors.  Pt to verbalize an understanding of the role that distorted thinking plays in creating fears,  excessive worry, and ruminations.  Progress: 20% Target Date:  01/02/2024 Frequency:  Bi-weekly Modality:  Cognitive Behavioral Therapy Interventions by Therapist:  Therapist will use CBT, Mindfulness exercises, Coping skills and Referrals, as needed by client. Client has verbally approved this treatment plan.  Karie Kirks, [facility]

## 2023-08-15 ENCOUNTER — Ambulatory Visit: Payer: Medicare HMO | Admitting: Psychology

## 2023-08-20 ENCOUNTER — Other Ambulatory Visit: Payer: Self-pay | Admitting: Psychiatry

## 2023-08-20 ENCOUNTER — Telehealth: Payer: Self-pay | Admitting: Psychiatry

## 2023-08-20 ENCOUNTER — Ambulatory Visit: Payer: Medicare HMO | Admitting: Psychology

## 2023-08-20 MED ORDER — DULOXETINE HCL 60 MG PO CPEP: 60.0000 mg | ORAL_CAPSULE | Freq: Every day | ORAL | 2 refills | Status: DC

## 2023-08-20 NOTE — Telephone Encounter (Signed)
Patient needs a refill on the cymbalta 30 mg.

## 2023-08-21 NOTE — Telephone Encounter (Signed)
Rx is ready for pick up.

## 2023-08-21 NOTE — Telephone Encounter (Signed)
left message that rx is ready for pickup

## 2023-08-28 ENCOUNTER — Ambulatory Visit: Payer: Medicare HMO | Admitting: Psychiatry

## 2023-09-04 ENCOUNTER — Ambulatory Visit: Payer: Medicare HMO | Admitting: Psychology

## 2023-09-05 ENCOUNTER — Ambulatory Visit: Payer: Medicare HMO | Admitting: Psychology
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
## 2023-10-20 ENCOUNTER — Ambulatory Visit: Payer: Medicare HMO | Admitting: Psychiatry

## 2023-10-23 ENCOUNTER — Encounter: Payer: Self-pay | Admitting: Psychiatry

## 2023-10-23 ENCOUNTER — Ambulatory Visit: Payer: Medicare HMO | Admitting: Psychiatry

## 2023-10-23 VITALS — BP 86/61 | HR 66 | Temp 96.6°F | Ht 69.0 in | Wt 302.2 lb

## 2023-10-23 DIAGNOSIS — G4700 Insomnia, unspecified: Secondary | ICD-10-CM | POA: Diagnosis not present

## 2023-10-23 DIAGNOSIS — F4310 Post-traumatic stress disorder, unspecified: Secondary | ICD-10-CM | POA: Diagnosis not present

## 2023-10-23 DIAGNOSIS — F331 Major depressive disorder, recurrent, moderate: Secondary | ICD-10-CM

## 2023-10-23 MED ORDER — DULOXETINE HCL 30 MG PO CPEP: 30.0000 mg | ORAL_CAPSULE | Freq: Every day | ORAL | 2 refills | Status: DC

## 2023-10-23 MED ORDER — DULOXETINE HCL 60 MG PO CPEP: 60.0000 mg | ORAL_CAPSULE | Freq: Every day | ORAL | 2 refills | Status: DC

## 2023-10-23 NOTE — Patient Instructions (Addendum)
Continue duloxetine 90 mg daily  Discontinue doxepin  Next appointment: 1/10 at 10:30

## 2023-10-24 MED ORDER — BUPROPION HCL ER (XL) 150 MG PO TB24: 150.0000 mg | ORAL_TABLET | Freq: Every day | ORAL | 1 refills | Status: DC

## 2023-10-24 NOTE — Addendum Note (Signed)
Addended by: Neysa Hotter on: 10/24/2023 10:59 AM   Modules accepted: Orders

## 2023-11-05 ENCOUNTER — Ambulatory Visit: Payer: Medicare HMO | Admitting: Psychology

## 2023-11-05 DIAGNOSIS — F331 Major depressive disorder, recurrent, moderate: Secondary | ICD-10-CM | POA: Diagnosis not present

## 2023-11-05 NOTE — Progress Notes (Signed)
[facility] Counselor/Therapist Progress Note  Patient ID: Sharon Boyd, MRN: [id number],    Date: 11/05/2023  Time Spent: 45 mins      start: 1300     end: 1345  Treatment Type: Individual Therapy  Reported Symptoms: Pt presents for session, via Caregility video.  Pt grants consent for the session and stating her understanding of the limits of virtual sessions, states she is in her home with no one present.  I shared with pt that I am in my office with no one else present here.  Mental Status Exam: Appearance:  Casual     Behavior: Appropriate  Motor: Normal  Speech/Language:  Clear and Coherent  Affect: Appropriate  Mood: depressed  Thought process: normal  Thought content:   WNL  Sensory/Perceptual disturbances:   WNL  Orientation: oriented to person, place, and time/date  Attention: Good  Concentration: Good  Memory: WNL  Fund of knowledge:  Good  Insight:   Good  Judgment:  Good  Impulse Control: Good   Risk Assessment: Danger to Self:  No Self-injurious Behavior: No Danger to Others: No Duty to Warn:no Physical Aggression / Violence:No  Access to Firearms a concern: No  Gang Involvement:No   Subjective: Pt shares that "I have been doing OK since our last session (07/30/23).  [name] has been sick a lot; he got pneumonia and then gave it to me.  We had to postpone his surgery twice.  He still has not had it done; we are waiting for the doctor's office to schedule it.  [name] has recovered from all of his issues at this point."  Pt shares she is still having issues with [name] and [name]; she shares, "[name] has continued to do things just to make me mad."  Pt shares that she has been reading several books for self care activities since our last session and has enjoyed that.  Pt has seen Dr. Vanetta Shawl last week and she added Buproprion to her medication regimen.  Pt will eat Thanksgiving dinner with [name] and [name] and she will also see her family for the  holiday as well.  Pt shares that [name] is doing well and pt is thankful for that.  Encouraged pt to continue with her self care activities and we will meet in 2 wks for a follow up session.  Interventions: Cognitive Behavioral Therapy  Diagnosis:MDD (major depressive disorder), recurrent episode, moderate (HCC)  Plan: Treatment Plan Strengths/Abilities:  Intelligent, Intuitive, Willing to participate in therapy Treatment Preferences:  Outpatient Individual Therapy Statement of Needs:  Patient is to use CBT, mindfulness and coping skills to help manage and/or decrease symptoms associated with their diagnosis. Symptoms:  Depressed/Irritable mood, worry, social withdrawal Problems Addressed:  Depressive thoughts, Sadness, Sleep issues, etc. Long Term Goals:  Pt to reduce overall level, frequency, and intensity of the feelings of depression as evidenced by decreased irritability, negative self talk, and helpless feelings from 6 to 7 days/week to 0 to 1 days/week, per client report, for at least 3 consecutive months.  Progress: 20% Short Term Goals:  Pt to verbally express understanding of the relationship between feelings of depression and their impact on thinking patterns and behaviors.  Pt to verbalize an understanding of the role that distorted thinking plays in creating fears, excessive worry, and ruminations.  Progress: 20% Target Date:  01/02/2024 Frequency:  Bi-weekly Modality:  Cognitive Behavioral Therapy Interventions by Therapist:  Therapist will use CBT, Mindfulness exercises, Coping skills and Referrals, as needed by client.  Client has verbally approved this treatment plan.  Karie Kirks, [facility]

## 2023-11-19 ENCOUNTER — Ambulatory Visit: Payer: Medicare HMO | Admitting: Psychology

## 2023-11-19 DIAGNOSIS — F331 Major depressive disorder, recurrent, moderate: Secondary | ICD-10-CM | POA: Diagnosis not present
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
## 2023-12-19 ENCOUNTER — Telehealth: Payer: Medicare HMO | Admitting: Psychiatry

## 2023-12-19 ENCOUNTER — Encounter: Payer: Self-pay | Admitting: Psychiatry

## 2023-12-19 DIAGNOSIS — F3341 Major depressive disorder, recurrent, in partial remission: Secondary | ICD-10-CM | POA: Diagnosis not present

## 2023-12-19 DIAGNOSIS — G4700 Insomnia, unspecified: Secondary | ICD-10-CM | POA: Diagnosis not present

## 2023-12-19 DIAGNOSIS — F4310 Post-traumatic stress disorder, unspecified: Secondary | ICD-10-CM | POA: Diagnosis not present

## 2023-12-19 MED ORDER — BUPROPION HCL ER (XL) 150 MG PO TB24: 150.0000 mg | ORAL_TABLET | Freq: Every day | ORAL | 2 refills | Status: DC

## 2023-12-19 MED ORDER — DULOXETINE HCL 60 MG PO CPEP: 60.0000 mg | ORAL_CAPSULE | Freq: Every day | ORAL | 3 refills | Status: DC

## 2023-12-19 MED ORDER — DULOXETINE HCL 30 MG PO CPEP: 30.0000 mg | ORAL_CAPSULE | Freq: Every day | ORAL | 3 refills | Status: DC

## 2023-12-19 NOTE — Patient Instructions (Signed)
 Continue duloxetine 90 mg daily  Continue bupropion 150 mg daily  Next appointment: 3/17 at 8 :30

## 2023-12-24 ENCOUNTER — Other Ambulatory Visit: Payer: Self-pay | Admitting: Psychiatry

## 2024-01-01 ENCOUNTER — Ambulatory Visit: Payer: Medicare HMO | Admitting: Psychology

## 2024-01-01 DIAGNOSIS — F331 Major depressive disorder, recurrent, moderate: Secondary | ICD-10-CM

## 2024-01-01 NOTE — Progress Notes (Signed)
[facility] Counselor/Therapist Progress Note  Patient ID: Sharon Boyd, MRN: [id number],    Date: 01/01/2024  Time Spent: 60 mins      start: 1300     end: 1400  Treatment Type: Individual Therapy  Reported Symptoms: Pt presents for session, via Caregility video.  Pt grants consent for the session and stating her understanding of the limits of virtual sessions, states she is in her home with no one present.  I shared with pt that I am in my office with no one else present here.  Mental Status Exam: Appearance:  Casual     Behavior: Appropriate  Motor: Normal  Speech/Language:  Clear and Coherent  Affect: Appropriate  Mood: depressed  Thought process: normal  Thought content:   WNL  Sensory/Perceptual disturbances:   WNL  Orientation: oriented to person, place, and time/date  Attention: Good  Concentration: Good  Memory: WNL  Fund of knowledge:  Good  Insight:   Good  Judgment:  Good  Impulse Control: Good   Risk Assessment: Danger to Self:  No Self-injurious Behavior: No Danger to Others: No Duty to Warn:no Physical Aggression / Violence:No  Access to Firearms a concern: No  Gang Involvement:No   Subjective: Pt shares that "I got through Christmas and New Year's; it was good for the kids and that was enjoyable to see."  Pt shares she saw some family for Christmas eve and [name] cooked Christmas lunch for their family and [name]'s parents who came over.  Pt reminds me that she has lived in this house with [name], and the kids for the past 6 yrs.  Pt provided the down payment for them to buy the house; she gave [name] and [name] $40,000.00 to buy their house where they all live and pt also pays $1000.00 of the $1600.00 monthly house payment.  Pt shares she has been having TMJ symptoms and those have been painful for her.  She is going to the dentist soon about this.  She also has an eye doctor appt coming up soon as well because she needs new glasses as well.   Pt shares that she and [name] went and got their hair cut yesterday.  Pt shares that [name]'s surgery had to be cancelled because he got RSV; he has been feeling better now; they have to reschedule his surgery.  Pt shares that she has been reading several books for self care activities since our last session and has enjoyed that.  She is also enjoying several TV shows as well.  Encouraged pt to continue with her self care activities and we will meet in 2 wks for a follow up session.  Interventions: Cognitive Behavioral Therapy  Diagnosis:MDD (major depressive disorder), recurrent episode, moderate (HCC)  Plan: Treatment Plan Strengths/Abilities:  Intelligent, Intuitive, Willing to participate in therapy Treatment Preferences:  Outpatient Individual Therapy Statement of Needs:  Patient is to use CBT, mindfulness and coping skills to help manage and/or decrease symptoms associated with their diagnosis. Symptoms:  Depressed/Irritable mood, worry, social withdrawal Problems Addressed:  Depressive thoughts, Sadness, Sleep issues, etc. Long Term Goals:  Pt to reduce overall level, frequency, and intensity of the feelings of depression as evidenced by decreased irritability, negative self talk, and helpless feelings from 6 to 7 days/week to 0 to 1 days/week, per client report, for at least 3 consecutive months.  Progress: 20% Short Term Goals:  Pt to verbally express understanding of the relationship between feelings of depression and their impact on  thinking patterns and behaviors.  Pt to verbalize an understanding of the role that distorted thinking plays in creating fears, excessive worry, and ruminations.  Progress: 20% Target Date:  01/01/2025 Frequency:  Bi-weekly Modality:  Cognitive Behavioral Therapy Interventions by Therapist:  Therapist will use CBT, Mindfulness exercises, Coping skills and Referrals, as needed by client. Client has verbally approved this treatment plan.  Karie Kirks,  [facility]

## 2024-01-15 ENCOUNTER — Ambulatory Visit: Payer: Medicare HMO | Admitting: Psychology

## 2024-01-15 DIAGNOSIS — F331 Major depressive disorder, recurrent, moderate: Secondary | ICD-10-CM | POA: Diagnosis not present

## 2024-01-15 NOTE — Progress Notes (Signed)
 [facility] Counselor/Therapist Progress Note  Patient ID: Sharon Boyd, MRN: [id number],    Date: 01/15/2024  Time Spent: 60 mins      start: 0900     end: 1000  Treatment Type: Individual Therapy  Reported Symptoms: Pt presents for session, via Caregility video.  Pt grants consent for the session and stating her understanding of the limits of virtual sessions, states she is in her home with no one present.  I shared with pt that I am in my office with no one else present here.  Mental Status Exam: Appearance:  Casual     Behavior: Appropriate  Motor: Normal  Speech/Language:  Clear and Coherent  Affect: Appropriate  Mood: depressed  Thought process: normal  Thought content:   WNL  Sensory/Perceptual disturbances:   WNL  Orientation: oriented to person, place, and time/date  Attention: Good  Concentration: Good  Memory: WNL  Fund of knowledge:  Good  Insight:   Good  Judgment:  Good  Impulse Control: Good   Risk Assessment: Danger to Self:  No Self-injurious Behavior: No Danger to Others: No Duty to Warn:no Physical Aggression / Violence:No  Access to Firearms a concern: No  Gang Involvement:No   Subjective: Pt shares that I recently had my eyes checked recently and the eye doctor referred me to another doctor because she thought I might have glaucoma.  My follow up appt is 01/21/24.  Pt is rightly afraid of this situation.  Pt shares that Sharon Boyd to me not to freak out about it because I don't know anything bad yet.  Pt shares again that Sharon Boyd does not cook things that pt and Sharon Boyd enjoy eating; pt has to cook if she is going to have what she and Sharon Boyd like.  Pt shares Sharon Boyd is mad at me because I do not come down for dinner and Sharon Boyd is mad because I don't come down to clean the kitchen.  Pt is still having jaw pain; she has not made an appt with the dentist; she plans to call them today to make the appt.  Sharon Boyd's surgery has not yet been  rescheduled; he has another cold; he is coughing and is stuffy.  Pt shares she has continued to read for her self care activity and she is enjoying the book she is working on.  She is also enjoying several TV shows as well.  Encouraged pt to continue with her self care activities and we will meet in 2 wks for a follow up session.  Interventions: Cognitive Behavioral Therapy  Diagnosis:MDD (major depressive disorder), recurrent episode, moderate (HCC)  Plan: Treatment Plan Strengths/Abilities:  Intelligent, Intuitive, Willing to participate in therapy Treatment Preferences:  Outpatient Individual Therapy Statement of Needs:  Patient is to use CBT, mindfulness and coping skills to help manage and/or decrease symptoms associated with their diagnosis. Symptoms:  Depressed/Irritable mood, worry, social withdrawal Problems Addressed:  Depressive thoughts, Sadness, Sleep issues, etc. Long Term Goals:  Pt to reduce overall level, frequency, and intensity of the feelings of depression as evidenced by decreased irritability, negative self talk, and helpless feelings from 6 to 7 days/week to 0 to 1 days/week, per client report, for at least 3 consecutive months.  Progress: 20% Short Term Goals:  Pt to verbally express understanding of the relationship between feelings of depression and their impact on thinking patterns and behaviors.  Pt to verbalize an understanding of the role that distorted thinking plays in creating fears, excessive worry, and ruminations.  Progress: 20% Target Date:  01/01/2025 Frequency:  Bi-weekly Modality:  Cognitive Behavioral Therapy Interventions by Therapist:  Therapist will use CBT, Mindfulness exercises, Coping skills and Referrals, as needed by client. Client has verbally approved this treatment plan.  Francis KATHEE Macintosh, [facility]

## 2024-02-05 ENCOUNTER — Ambulatory Visit: Payer: Medicare HMO | Admitting: Psychology

## 2024-02-11 ENCOUNTER — Ambulatory Visit: Payer: Medicare HMO | Admitting: Psychology

## 2024-02-11 DIAGNOSIS — F331 Major depressive disorder, recurrent, moderate: Secondary | ICD-10-CM | POA: Diagnosis not present
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
## 2024-02-23 ENCOUNTER — Telehealth: Payer: Medicare HMO | Admitting: Psychiatry

## 2024-02-23 ENCOUNTER — Encounter: Payer: Self-pay | Admitting: Psychiatry

## 2024-02-23 DIAGNOSIS — F4310 Post-traumatic stress disorder, unspecified: Secondary | ICD-10-CM

## 2024-02-23 DIAGNOSIS — F3341 Major depressive disorder, recurrent, in partial remission: Secondary | ICD-10-CM | POA: Diagnosis not present

## 2024-02-23 MED ORDER — BUPROPION HCL ER (XL) 150 MG PO TB24: 150.0000 mg | ORAL_TABLET | Freq: Every day | ORAL | 5 refills | Status: DC

## 2024-02-23 NOTE — Patient Instructions (Signed)
 Continue duloxetine 90 mg daily  Continue bupropion 150 mg daily  Next appointment: 5/7 at 9 am

## 2024-02-25 ENCOUNTER — Ambulatory Visit: Admitting: Psychology

## 2024-02-25 DIAGNOSIS — F331 Major depressive disorder, recurrent, moderate: Secondary | ICD-10-CM | POA: Diagnosis not present
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
## 2024-03-31 ENCOUNTER — Ambulatory Visit: Admitting: Psychology

## 2024-03-31 DIAGNOSIS — F331 Major depressive disorder, recurrent, moderate: Secondary | ICD-10-CM

## 2024-03-31 NOTE — Progress Notes (Signed)
 [facility] Counselor/Therapist Progress Note  Patient ID: Sharon Boyd, MRN: [id number],    Date: 03/31/2024  Time Spent: 60 mins      start: 0800     end: 0900  Treatment Type: Individual Therapy  Reported Symptoms: Pt presents for session, via Caregility video.  Pt grants consent for the session and stating her understanding of the limits of virtual sessions, states she is in her home with no one present.  I shared with pt that I am in my office with no one else present here.  Mental Status Exam: Appearance:  Casual     Behavior: Appropriate  Motor: Normal  Speech/Language:  Clear and Coherent  Affect: Appropriate  Mood: depressed  Thought process: normal  Thought content:   WNL  Sensory/Perceptual disturbances:   WNL  Orientation: oriented to person, place, and time/date  Attention: Good  Concentration: Good  Memory: WNL  Fund of knowledge:  Good  Insight:   Good  Judgment:  Good  Impulse Control: Good   Risk Assessment: Danger to Self:  No Self-injurious Behavior: No Danger to Others: No Duty to Warn:no Physical Aggression / Violence:No  Access to Firearms a concern: No  Gang Involvement:No   Subjective: Pt shares that "I have been irritable lately; Caleb told me I have been irritable and [name] has been getting on my nerves.  I need a break from everything.  It has been 5 years since [name] and I had a vacation.  Royston Cornea and Bishop Bullock are going on vacation again this year and are leaving us  home.  [name] and I got into an argument again yesterday.  I had to take [name] to the dentist yesterday and [name]'s work schedule changed on that day so she got mad at me because I was late picking her up at work."  Pt shares that Bishop Bullock was so mad at her and was cussing at her and she also complained to [place] about pt.  Pt's feelings were hurt but the interaction.  Pt feels like she and [name] get blamed for anything that goes wrong in the house.  Valinda Gault is taking pt to  the bank next week to see if she can get her own car so she can go and come as she pleases.  Pt reminds me that she gives Royston Cornea and Bishop Bullock $1200.00 per month for her bedroom and [name]'s bedroom; she also gave them money for the down payment for the house and their car.  She also has to buy her's and [name]'s food if they want the kind of food they want.  [name]'s mouth is still sore from having to have three teeth fixed yesterday; he understands now that he needs to brush better.  Pt shares that 4/30 is the anniversary of her son's (Danny-[name]'s father) death and that is one reason that pt is irritable lately.  Pt shares that Bishop Bullock went to the doctor recently because she has a chronic cough (she smokes and vapes both nicotine and pot daily).  Bishop Bullock has to go back for more testing; they think she has emphysema and COPD; she is only 68 yo.  Pt shares she went to the dermatologist and her foot fungus cleared up and she also has psoriasis on her feet, hands, and her head.  Pt shares that [name] will be 68 yo next month; he is having trouble with reading (he is in 2nd grade) and the school wants him to have extra work to help him get up to  speed with his classmates.  Pt has an appt with Dr. Edda Goo next month.  [name] still needs to have his surgery (testicular surgery) and she hopes to get it done this summer.  Pt shares that she has not been sleeping very well lately because of her disagreements with Royston Cornea and Bishop Bullock lately.  Pt has a dentist appt on 05-04-24 (Danny's death anniversary) to fix her damaged tooth.  She has a pain management appt tomorrow too.  Pt shares that she continues to read a lot; "I have finished 3 books since our last session and I have enjoyed that."  Valinda Gault gave her a new book to read as well.  She is also enjoying several TV shows as well.  Encouraged pt to continue with her self care activities and we will meet in 3 wks for a follow up session.  Interventions: Cognitive Behavioral  Therapy  Diagnosis:MDD (major depressive disorder), recurrent episode, moderate (HCC)  Plan: Treatment Plan Strengths/Abilities:  Intelligent, Intuitive, Willing to participate in therapy Treatment Preferences:  Outpatient Individual Therapy Statement of Needs:  Patient is to use CBT, mindfulness and coping skills to help manage and/or decrease symptoms associated with their diagnosis. Symptoms:  Depressed/Irritable mood, worry, social withdrawal Problems Addressed:  Depressive thoughts, Sadness, Sleep issues, etc. Long Term Goals:  Pt to reduce overall level, frequency, and intensity of the feelings of depression as evidenced by decreased irritability, negative self talk, and helpless feelings from 6 to 7 days/week to 0 to 1 days/week, per client report, for at least 3 consecutive months.  Progress: 20% Short Term Goals:  Pt to verbally express understanding of the relationship between feelings of depression and their impact on thinking patterns and behaviors.  Pt to verbalize an understanding of the role that distorted thinking plays in creating fears, excessive worry, and ruminations.  Progress: 20% Target Date:  01/01/2025 Frequency:  Bi-weekly Modality:  Cognitive Behavioral Therapy Interventions by Therapist:  Therapist will use CBT, Mindfulness exercises, Coping skills and Referrals, as needed by client. Client has verbally approved this treatment plan.  Jhonny Moss, [facility]
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
## 2024-04-14 ENCOUNTER — Encounter: Payer: Self-pay | Admitting: Psychiatry

## 2024-04-14 ENCOUNTER — Telehealth: Admitting: Psychiatry

## 2024-04-14 DIAGNOSIS — F331 Major depressive disorder, recurrent, moderate: Secondary | ICD-10-CM

## 2024-04-14 DIAGNOSIS — F4310 Post-traumatic stress disorder, unspecified: Secondary | ICD-10-CM

## 2024-04-14 NOTE — Patient Instructions (Signed)
 Continue duloxetine  90 mg daily  Continue bupropion  150 mg daily  Next appointment: 6/13 at 8 20

## 2024-04-21 ENCOUNTER — Ambulatory Visit: Admitting: Psychology

## 2024-04-21 DIAGNOSIS — F331 Major depressive disorder, recurrent, moderate: Secondary | ICD-10-CM | POA: Diagnosis not present
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
## 2024-05-07 ENCOUNTER — Ambulatory Visit: Admitting: Psychology

## 2024-05-07 DIAGNOSIS — F331 Major depressive disorder, recurrent, moderate: Secondary | ICD-10-CM

## 2024-05-07 NOTE — Progress Notes (Signed)
 [facility] Counselor/Therapist Progress Note  Patient ID: Sharon Boyd, MRN: [id number],    Date: 05/07/2024  Time Spent: 60 mins      start: 0800     end: 0900  Treatment Type: Individual Therapy  Reported Symptoms: Pt presents for session, via Caregility video.  Pt grants consent for the session and stating her understanding of the limits of virtual sessions, states she is in her home with no one present.  I shared with pt that I am in my office with no one else present here.  Mental Status Exam: Appearance:  Casual     Behavior: Appropriate  Motor: Normal  Speech/Language:  Clear and Coherent  Affect: Appropriate  Mood: depressed  Thought process: normal  Thought content:   WNL  Sensory/Perceptual disturbances:   WNL  Orientation: oriented to person, place, and time/date  Attention: Good  Concentration: Good  Memory: WNL  Fund of knowledge:  Good  Insight:   Good  Judgment:  Good  Impulse Control: Good   Risk Assessment: Danger to Self:  No Self-injurious Behavior: No Danger to Others: No Duty to Warn:no Physical Aggression / Violence:No  Access to Firearms a concern: No  Gang Involvement:No   Subjective: Pt shares that "I have been OK since our last session.  The kids finish school next week and then I will be here with all three kids all day and that is usually tough."  Pt shares that Zoila Hines (66 yo) has Asperger's and Royston Cornea and Bishop Bullock will not get him any help; Rannie Byars (66 yo) has a history of panic attacks and they never have gotten him help either.  Pt shares she is still trying to avoid Bishop Bullock even if they are in the same car, if pt is borrowing the car.  Pt shares she did go to the dentist on 4/30 and she had to take a loan out for $4800.00 ($135.00 per month for 36 months) for two crowns and a partial for her bottom teeth as well as.  She has recovered from the procedures and is glad to be finished with everything.  The dentist also referred her to a  TMJ specialist in [place] and she has to save $225.00 for the initial appt; her appt is in July.  Pt shares that she has not been reading any since our last session; she was, instead, watching the TV show, "The Handmaid's Tale."  She really enjoyed the show; "I binge watched it."  Pt shares that she is also following the [employer] trial because she finds that so interesting too.  She is planning to pick reading back up again soon.  Encouraged pt to continue with her self care activities and we will meet in 2 wks for a follow up session.  Interventions: Cognitive Behavioral Therapy  Diagnosis:MDD (major depressive disorder), recurrent episode, moderate (HCC)  Plan: Treatment Plan Strengths/Abilities:  Intelligent, Intuitive, Willing to participate in therapy Treatment Preferences:  Outpatient Individual Therapy Statement of Needs:  Patient is to use CBT, mindfulness and coping skills to help manage and/or decrease symptoms associated with their diagnosis. Symptoms:  Depressed/Irritable mood, worry, social withdrawal Problems Addressed:  Depressive thoughts, Sadness, Sleep issues, etc. Long Term Goals:  Pt to reduce overall level, frequency, and intensity of the feelings of depression as evidenced by decreased irritability, negative self talk, and helpless feelings from 6 to 7 days/week to 0 to 1 days/week, per client report, for at least 3 consecutive months.  Progress: 20% Short Term  Goals:  Pt to verbally express understanding of the relationship between feelings of depression and their impact on thinking patterns and behaviors.  Pt to verbalize an understanding of the role that distorted thinking plays in creating fears, excessive worry, and ruminations.  Progress: 20% Target Date:  01/01/2025 Frequency:  Bi-weekly Modality:  Cognitive Behavioral Therapy Interventions by Therapist:  Therapist will use CBT, Mindfulness exercises, Coping skills and Referrals, as needed by client. Client has verbally  approved this treatment plan.  Jhonny Moss, [facility]
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
## 2024-05-19 ENCOUNTER — Ambulatory Visit: Admitting: Psychology

## 2024-05-21 ENCOUNTER — Telehealth: Admitting: Psychiatry
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
## 2024-05-26 ENCOUNTER — Telehealth: Admitting: Psychiatry

## 2024-05-26 ENCOUNTER — Encounter: Payer: Self-pay | Admitting: Psychiatry

## 2024-05-26 DIAGNOSIS — F331 Major depressive disorder, recurrent, moderate: Secondary | ICD-10-CM | POA: Diagnosis not present

## 2024-05-26 DIAGNOSIS — F4310 Post-traumatic stress disorder, unspecified: Secondary | ICD-10-CM

## 2024-05-26 MED ORDER — DULOXETINE HCL 60 MG PO CPEP: 120.0000 mg | ORAL_CAPSULE | Freq: Every day | ORAL | 1 refills | Status: DC

## 2024-05-26 NOTE — Patient Instructions (Signed)
 Increase duloxetine  120 mg daily  Continue bupropion  150 mg daily  Next appointment: 7/25 at 8 20

## 2024-05-31 ENCOUNTER — Other Ambulatory Visit: Payer: Self-pay | Admitting: Nurse Practitioner

## 2024-05-31 DIAGNOSIS — Z780 Asymptomatic menopausal state: Secondary | ICD-10-CM

## 2024-05-31 DIAGNOSIS — Z1231 Encounter for screening mammogram for malignant neoplasm of breast: Secondary | ICD-10-CM

## 2024-06-27 ENCOUNTER — Other Ambulatory Visit: Payer: Self-pay | Admitting: Psychiatry
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
## 2024-06-28 ENCOUNTER — Other Ambulatory Visit: Payer: Self-pay | Admitting: Psychiatry

## 2024-06-29 ENCOUNTER — Other Ambulatory Visit: Payer: Self-pay | Admitting: Psychiatry

## 2024-07-02 ENCOUNTER — Telehealth: Admitting: Psychiatry

## 2024-07-02 ENCOUNTER — Encounter: Payer: Self-pay | Admitting: Psychiatry

## 2024-07-02 DIAGNOSIS — F4310 Post-traumatic stress disorder, unspecified: Secondary | ICD-10-CM | POA: Diagnosis not present

## 2024-07-02 DIAGNOSIS — F330 Major depressive disorder, recurrent, mild: Secondary | ICD-10-CM

## 2024-07-02 MED ORDER — DULOXETINE HCL 30 MG PO CPEP: 30.0000 mg | ORAL_CAPSULE | Freq: Every day | ORAL | 1 refills | Status: DC

## 2024-07-02 NOTE — Patient Instructions (Signed)
 Continue duloxetine  90 mg daily  Continue bupropion  150 mg daily  Next appointment: 9/12 at 8 20

## 2024-07-26 ENCOUNTER — Other Ambulatory Visit: Payer: Self-pay | Admitting: Psychiatry
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
## 2024-08-20 ENCOUNTER — Encounter: Payer: Self-pay | Admitting: Psychiatry

## 2024-08-20 ENCOUNTER — Telehealth: Admitting: Psychiatry

## 2024-08-20 DIAGNOSIS — F4310 Post-traumatic stress disorder, unspecified: Secondary | ICD-10-CM | POA: Diagnosis not present

## 2024-08-20 DIAGNOSIS — F330 Major depressive disorder, recurrent, mild: Secondary | ICD-10-CM | POA: Diagnosis not present

## 2024-08-20 DIAGNOSIS — G4700 Insomnia, unspecified: Secondary | ICD-10-CM | POA: Diagnosis not present

## 2024-08-20 MED ORDER — DULOXETINE HCL 60 MG PO CPEP: 60.0000 mg | ORAL_CAPSULE | Freq: Every day | ORAL | 1 refills | Status: DC

## 2024-08-20 MED ORDER — BUPROPION HCL ER (XL) 150 MG PO TB24: 150.0000 mg | ORAL_TABLET | Freq: Every day | ORAL | 5 refills | Status: AC

## 2024-08-20 MED ORDER — DULOXETINE HCL 30 MG PO CPEP: 30.0000 mg | ORAL_CAPSULE | Freq: Every day | ORAL | 1 refills | Status: DC

## 2024-08-20 NOTE — Patient Instructions (Signed)
 Continue duloxetine  90 mg daily  Continue bupropion  150 mg daily  Next appointment: 10/31 at 8 20, video

## 2024-09-26 ENCOUNTER — Other Ambulatory Visit: Payer: Self-pay | Admitting: Psychiatry
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
## 2024-10-08 ENCOUNTER — Telehealth: Admitting: Psychiatry

## 2024-10-08 ENCOUNTER — Encounter: Payer: Self-pay | Admitting: Psychiatry

## 2024-10-08 DIAGNOSIS — G4700 Insomnia, unspecified: Secondary | ICD-10-CM

## 2024-10-08 DIAGNOSIS — F3341 Major depressive disorder, recurrent, in partial remission: Secondary | ICD-10-CM

## 2024-10-08 DIAGNOSIS — F4310 Post-traumatic stress disorder, unspecified: Secondary | ICD-10-CM | POA: Diagnosis not present

## 2024-10-08 MED ORDER — DULOXETINE HCL 60 MG PO CPEP: 60.0000 mg | ORAL_CAPSULE | Freq: Every day | ORAL | 5 refills | Status: AC

## 2024-10-08 MED ORDER — DULOXETINE HCL 30 MG PO CPEP: 30.0000 mg | ORAL_CAPSULE | Freq: Every day | ORAL | 1 refills | Status: DC

## 2024-10-08 NOTE — Patient Instructions (Signed)
 Continue duloxetine  90 mg daily  Continue bupropion  150 mg daily  Next appointment: 1/7 at 8 20

## 2024-10-26 NOTE — Progress Notes (Addendum)
 Subjective Patient ID: Sharon Boyd is a 68 y.o. female.    Patient presents for concerns of abscess on back and leg.  Has a history of having them in the past.  Has not used any medications, current episode by 6 days.   History provided by:  Patient Language interpreter used: No   Rash    Review of Systems  Skin:  Positive for rash.    Patient History  Allergies: No Known Allergies  Past Medical History:  Diagnosis Date   Chronic pain    COPD (chronic obstructive pulmonary disease)    Depression    DJD (degenerative joint disease)    Fibromyalgia    High blood pressure    Insomnia    Migraine    Osteoarthritis    Restless leg syndrome    Sleep apnea    Stage 3 chronic kidney disease (CMS/HCC)    History reviewed. No pertinent surgical history. Social History   Socioeconomic History   Marital status: Widowed    Spouse name: Not on file   Number of children: Not on file   Years of education: Not on file   Highest education level: Not on file  Occupational History   Not on file  Tobacco Use   Smoking status: Never   Smokeless tobacco: Not on file  Substance and Sexual Activity   Alcohol use: Not on file   Drug use: Not on file   Sexual activity: Not on file  Other Topics Concern   Not on file  Social History Narrative   Not on file   History reviewed. No pertinent family history. Current Outpatient Medications on File Prior to Visit  Medication Sig Dispense Refill   albuterol  108 (90 Base) MCG/ACT inhaler INHALE 1-2 PUFFS BY MOUTH EVERY 4-6 HOURS AS NEEDED FOR WHEEZING AND SHORTNESS OF BREATH     ALPRAZolam  (Xanax ) 0.5 MG tablet TAKE 1 TABLET BY MOUTH TWICE A DAY AS NEEDED FOR ANXIETY     budesonide-formoterol (Symbicort) 160-4.5 MCG/ACT inhaler Inhale 2 puffs  in the morning and 2 puffs in the evening.     buPROPion  XL (Wellbutrin  XL) 150 MG 24 hr tablet Take 150 mg by mouth.     DULoxetine  (Cymbalta ) 30 MG DR capsule Take  30 mg by mouth.     fluticasone  (Flonase) 50 MCG/ACT nasal spray Administer 1 spray into affected nostril(s).     hydroCHLOROthiazide (HYDRODiuril) 25 MG tablet Take 25 mg by mouth.     Multiple Vitamin (Multi-Vitamin) tablet Take 1 tablet by mouth 1 (one) time each day.     oxyCODONE-acetaminophen  (Percocet) 10-325 MG tablet Take 1 tablet by mouth.     potassium chloride  CR (KLOR-CON ) 20 MEQ ER tablet Take 20 mEq by mouth.     pregabalin  (Lyrica ) 200 MG capsule 2 (two) times a day     rizatriptan (Maxalt) 10 MG tablet TAKE 1 TABLET BY MOUTH AT ONSET FOR HEADACHE/MIGRAINE *MAX OF 2 IN 24 HOURS*     rosuvastatin (Crestor) 20 MG tablet TAKE 1 TABLET BY MOUTH ONCE DAILY *DISCONTINUE SIMVASTATIN AND VITAMIN D*     Skyrizi Pen 150 MG/ML solution auto-injector      tiZANidine  (Zanaflex ) 4 MG tablet Take 1 tablet by mouth 1 (one) time each day.     topiramate  50 MG tablet Take 1 tablet by mouth 1 (one) time each day in the evening.     cyanocobalamin (Vitamin B-12) 1000 MCG tablet Take 1,000 mcg by mouth 1 (one) time  each day.     DULoxetine  (Cymbalta ) 60 MG DR capsule Take 60 mg by mouth. (Patient not taking: Reported on 10/26/2024)     ergocalciferol (Vitamin D2) 1.25 MG (50000 UT) capsule TAKE 2 CAPSULES BY MOUTH WEEKLY (Patient not taking: Reported on 10/26/2024)     No current facility-administered medications on file prior to visit.    Objective  Vitals:   10/26/24 1139  BP: 122/77  Pulse: 93  Resp: 18  Temp: 36.7 C (98 F)  TempSrc: Tympanic  SpO2: 97%  Weight: 127 kg  Height: 5' 9  PainSc: 0-No pain               No results found.  Physical Exam Vitals and nursing note reviewed.  Constitutional:      General: She is not in acute distress.    Appearance: Normal appearance.  HENT:     Head: Normocephalic.     Nose: Nose normal.     Mouth/Throat:     Mouth: Mucous membranes are moist.  Eyes:     Extraocular Movements: Extraocular movements intact.      Conjunctiva/sclera: Conjunctivae normal.  Cardiovascular:     Rate and Rhythm: Normal rate.  Pulmonary:     Effort: Pulmonary effort is normal.  Abdominal:     General: There is no distension.  Musculoskeletal:        General: Normal range of motion.     Cervical back: Normal range of motion.  Skin:    General: Skin is warm.     Findings: Lesion and rash present.     Comments: Draining abscess on upper back, 3 satellite areas on upper back that are deroofed.  1 area over right knee that is deroofed.  Neurological:     Mental Status: She is alert and oriented to person, place, and time.  Psychiatric:        Mood and Affect: Mood normal.        Behavior: Behavior normal.     No results found for this visit on 10/26/24.     Procedures MDM:     1 Acute, uncomplicated illness or injury     Assessment requiring historian other than patient: No     Independent visualization of image, tracing, or test: No     Discussion of management with another provider: No     Risk:: Moderate            Assessment/Plan Diagnoses and all orders for this visit:  Abscess -     sulfamethoxazole-trimethoprim (Bactrim DS) 800-160 MG tablet; Take 1 tablet by mouth in the morning and 1 tablet in the evening. Do all this for 7 days. -     Aerobic culture     Disposition Status: Home  There are no Patient Instructions on file for this visit.  Progress note signed by Debby Rung, PA on 10/26/24 at 12:16 PM

## 2024-11-08 NOTE — Progress Notes (Signed)
 [facility] Follow Up Visit   Patient Name: Sharon Boyd, female   Patient DOB: [birth year] Date of Service: 11/08/2024  Patient MRN: [id number] Provider Creating Note: Woodward Brought, MD  [phone] Primary Care Physician: Odell Chard, Edra, MD   [address] [place] [address] Additional Physicians/ Providers:   Impression/Recommendations   Ms. GERTUDE BENITO is 68 y.o. female with chronic pain syndrome, fibromyalgia, anxiety, depression, GERD, hyperlipidemia, hypothyroidism, sleep apnea who presents for hospital follow up for acute kidney injury with hyperkalemia, metabolic acidosis on chronic kidney disease stage IIIA. Creatinine 0.87.   Chronic kidney disease stage IIIA:  renal function improved when taken off nonsteroidal anti-inflammatory agents. Renal function is normal.  - if renal function normal today, will release her from nephrology. She may follow up as needed.   Hypertension: 127/85. Currently on hydrochlorothiazide. No peripheral edema.   Hypokalemia: secondary to hydrochlorothiazide - Continue potassium supplementation  Hyperlipidemia - rosuvastatin.   Patient Active Problem List  Diagnosis   Acute kidney failure (HCC)   Chronic kidney disease stage 3A (HCC)   Hyperkalemia   Acute metabolic acidosis   Benign essential hypertension   Hypokalemia    Orders Placed This Encounter   Renal Function Panel   Urinalysis   Urine Albumin / Creatinine Ratio   CBC and Differential   PTH, intact       Return if symptoms worsen or fail to improve.  Chief Complaint   Chief Complaint  Patient presents with   Follow-up    History of Present Illness  Ms. [name] presents for follow up.   Patient has brought her home blood pressure log. Her home readings show systolic 120-130s and diastolic in the 70s. She is only on hydrochlorothiazide. Denies any lower extremity swelling.   Patient denies any pain medications. 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 She states she cannot see a TMJ specialist due to financial constraints. She is currently on tizanidine .   Patient states she is taking all her medications as prescribed. Denies use of nonsteroidal anti-inflammatory agents.   Buproprion was added to her depression regimen since last visit.   No recent hospitalizations.   Medications   Current Outpatient Medications:    buPROPion  XL (WELLBUTRIN  XL) 150 MG 24 hr tablet, Take 1 tablet (150 mg total) by mouth daily., Disp: , Rfl:    potassium chloride  (K-TAB ) 20 MEQ CR tablet, Take 1 tablet by mouth 1 (one) time each day, Disp: , Rfl:    albuterol  HFA (PROVENTIL  HFA;VENTOLIN  HFA) 108 (90 Base) MCG/ACT inhaler, Inhale 2 puffs Daily as needed, Disp: , Rfl:    ALPRAZolam  (XANAX ) 0.5 MG tablet, Take 1 tablet by mouth at night if needed, Disp: , Rfl:    budesonide-formoterol (SYMBICORT) 160-4.5 MCG/ACT inhaler, Inhale 1 puff  in the morning and 1 puff before bedtime., Disp: , Rfl:    DULOXETINE  HCL PO, Take 120 mg by mouth 1 (one) time each day, Disp: , Rfl:    fluticasone  (FLONASE) 50 MCG/ACT nasal spray, Administer 1 spray into affected nostril(s) 1 (one) time each day if needed, Disp: , Rfl:    hydroCHLOROthiazide 25 MG tablet, Take 1 tablet (25 mg total) by mouth 1 (one) time each day, Disp: 30 tablet, Rfl: 0   Multiple Vitamin (Multi-Vitamin) tablet, Take 1 tablet by mouth 1 (one) time each day, Disp: , Rfl:    oxyCODONE-acetaminophen  (PERCOCET) 10-325 MG per tablet, Take 1 tablet  by mouth in the morning and 1 tablet in the evening and 1 tablet before bedtime., Disp: , Rfl:    oxygen (O2) gas, Inhale 2 L/min continuously via nasal canula nightly as needed, Disp: , Rfl:    pantoprazole (PROTONIX) 40 MG EC tablet, Take 40 mg by mouth in the morning., Disp: , Rfl:    pregabalin  (LYRICA ) 200 MG capsule, 2 (two) times a day, Disp: , Rfl:    rizatriptan (MAXALT) 10 MG tablet, Take  1/2 tablet in the morning & take 1 tablet nightly., Disp: , Rfl:    rosuvastatin (CRESTOR) 20 MG tablet, TAKE 1 TABLET BY MOUTH ONCE DAILY *DISCONTINUE SIMVASTATIN AND VITAMIN D*, Disp: , Rfl:    tiZANidine  (ZANAFLEX ) 4 MG tablet, take 1 tablet by mouth twice daily as needed, Disp: , Rfl:    topiramate  (TOPAMAX ) 50 MG tablet, Take 1 tablet by mouth 1 (one) time each day, Disp: , Rfl:    ZTlido 1.8 % patch, 1 (one) time each day if needed, Disp: , Rfl:    Allergies Patient has no known allergies.  History Past Medical History:  Diagnosis Date   Acute kidney failure (HCC) 01/09/2022   Acute metabolic acidosis 01/09/2022   Anxiety    Benign essential hypertension 01/09/2022   Chronic kidney disease stage 3A (HCC) 01/09/2022   Chronic pain    Fibromyalgia    Gastroesophageal reflux disease    Hyperkalemia 01/09/2022   Hyperlipidemia    Hypokalemia 06/18/2023   Hypothyroidism    Major depressive disorder    Osteoarthritis    Sleep apnea     Past Surgical History:  Procedure Laterality Date   BACK SURGERY     Family History  Problem Relation Age of Onset   Heart disease Father    Kidney disease Father    Social History   Tobacco Use   Smoking status: Former    Current packs/day: 0.00    Types: Cigarettes    Quit date: 1990    Years since quitting: 35.9   Smokeless tobacco: Never  Substance Use Topics   Alcohol use: Not Currently     Physical Exam  Vitals BP 128/84 (BP Location: Left upper arm, Patient Position: Standing)   Pulse 87   Temp 96.9 F   Wt 286 lb 9.6 oz (130 kg)   SpO2 97%   Vitals reviewed. Constitutional: She is oriented to person, place, and time. She appears well-developed.  HEENT:  Head: Normocephalic and atraumatic. Mouth/Throat: Oropharynx is clear and moist.  Eyes: Pupils are equal, round, and reactive to light.  Neck: Neck supple.  Cardiovascular:  Normal rate and regular rhythm.           Pulmonary/Chest:  Effort normal and breath sounds normal.  Abdominal: Soft.  Neurological: She is alert and oriented to person, place, and time.  Skin: Skin is warm and dry.     Laboratory Studies  Chemistry  Lab Units 06/18/23 1540 11/13/22 1139  SODIUM mmol/L 145 143  POTASSIUM mmol/L 3.8 3.8  CHLORIDE mmol/L 105 108  CO2 mmol/L 32 27  CALCIUM mg/dL 9.7 9.4  PHOSPHORUS mg/dL 3.2 3.4  PTH pg/mL 72  --   GLUCOSE mg/dL 870* 868*  ALBUMIN g/dL 4.3 4.0  BUN mg/dL 25 24  CREATININE mg/dL 9.12 9.01        No lab exists for component: IRON SATURATION, TRANSSATPER  CBC  Lab Units 03/22/24 0854 06/18/23 1540  WBC AUTO Thousand/uL  --  7.3  HEMOGLOBIN  g/dL  --  85.5  HEMOGLOBIN URINE  NEGATIVE NEGATIVE  HEMATOCRIT %  --  43.8  MCV fL  --  79.6*  PLATELETS AUTO Thousand/uL  --  218    Urine  Lab Units 03/22/24 0854 06/18/23 1540 11/13/22 1139  COLOR U  YELLOW YELLOW  --   KETONES U MG/DL  NEGATIVE NEGATIVE  --   PROT/CREAT RATIO UR mg/g creat  --  0.088  88  --   ALB MG/G CREAT UR mcg/mg creat  --   --  5        No lab exists for component: CYCLOSPORITR     Woodward Brought, MD  [address], [place]

## 2024-11-22 ENCOUNTER — Other Ambulatory Visit: Payer: Self-pay | Admitting: Psychiatry
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
## 2024-12-15 ENCOUNTER — Telehealth: Admitting: Psychiatry

## 2024-12-15 ENCOUNTER — Encounter: Payer: Self-pay | Admitting: Psychiatry

## 2024-12-15 DIAGNOSIS — F3341 Major depressive disorder, recurrent, in partial remission: Secondary | ICD-10-CM | POA: Diagnosis not present

## 2024-12-15 DIAGNOSIS — F4310 Post-traumatic stress disorder, unspecified: Secondary | ICD-10-CM

## 2024-12-15 MED ORDER — DULOXETINE HCL 30 MG PO CPEP: 30.0000 mg | ORAL_CAPSULE | Freq: Every day | ORAL | 3 refills | Status: AC

## 2024-12-15 NOTE — Patient Instructions (Signed)
 Continue duloxetine  90 mg daily  Continue bupropion  150 mg daily  Next appointment: 3/6 at 8 am,

## 2025-02-11 ENCOUNTER — Telehealth: Admitting: Psychiatry
